# Patient Record
Sex: Male | Born: 1965 | Race: White | Hispanic: No | Marital: Married | Smoking: Never smoker
Health system: Southern US, Community
[De-identification: ages and names within clinical notes are randomized; demographics above are authoritative.]

## PROBLEM LIST (undated history)

## (undated) DIAGNOSIS — R059 Cough, unspecified: Secondary | ICD-10-CM

## (undated) DIAGNOSIS — R05 Cough: Secondary | ICD-10-CM

## (undated) DIAGNOSIS — M1990 Unspecified osteoarthritis, unspecified site: Secondary | ICD-10-CM

## (undated) HISTORY — PX: KNEE ARTHROSCOPY: SUR90

---

## 2002-11-30 ENCOUNTER — Ambulatory Visit (HOSPITAL_BASED_OUTPATIENT_CLINIC_OR_DEPARTMENT_OTHER): Admission: RE | Admit: 2002-11-30 | Discharge: 2002-11-30 | Payer: Self-pay | Admitting: Orthopedic Surgery

## 2003-01-09 HISTORY — PX: BICEPS TENDON REPAIR: SHX566

## 2006-03-22 ENCOUNTER — Emergency Department (HOSPITAL_COMMUNITY): Admission: EM | Admit: 2006-03-22 | Discharge: 2006-03-22 | Payer: Self-pay | Admitting: Emergency Medicine

## 2012-01-09 HISTORY — PX: KNEE ARTHROSCOPY: SUR90

## 2012-11-20 ENCOUNTER — Other Ambulatory Visit: Payer: Self-pay | Admitting: Orthopedic Surgery

## 2012-11-27 ENCOUNTER — Encounter (HOSPITAL_COMMUNITY): Payer: Self-pay

## 2012-11-27 ENCOUNTER — Encounter (HOSPITAL_COMMUNITY)
Admission: RE | Admit: 2012-11-27 | Discharge: 2012-11-27 | Disposition: A | Discharge status: Home / Self Care | Payer: BC Managed Care – PPO | Source: Ambulatory Visit | Attending: Orthopedic Surgery | Admitting: Orthopedic Surgery

## 2012-11-27 ENCOUNTER — Other Ambulatory Visit (HOSPITAL_COMMUNITY): Payer: Self-pay | Admitting: *Deleted

## 2012-11-27 DIAGNOSIS — Z01812 Encounter for preprocedural laboratory examination: Secondary | ICD-10-CM | POA: Insufficient documentation

## 2012-11-27 DIAGNOSIS — Z01818 Encounter for other preprocedural examination: Secondary | ICD-10-CM | POA: Insufficient documentation

## 2012-11-27 DIAGNOSIS — Z01810 Encounter for preprocedural cardiovascular examination: Secondary | ICD-10-CM | POA: Insufficient documentation

## 2012-11-27 HISTORY — DX: Unspecified osteoarthritis, unspecified site: M19.90

## 2012-11-27 HISTORY — DX: Cough, unspecified: R05.9

## 2012-11-27 HISTORY — DX: Cough: R05

## 2012-11-27 LAB — URINALYSIS, ROUTINE W REFLEX MICROSCOPIC
Glucose, UA: NEGATIVE mg/dL
Ketones, ur: NEGATIVE mg/dL
Leukocytes, UA: NEGATIVE
Nitrite: NEGATIVE
Specific Gravity, Urine: 1.007 (ref 1.005–1.030)
Urobilinogen, UA: 0.2 mg/dL (ref 0.0–1.0)
pH: 6 (ref 5.0–8.0)

## 2012-11-27 LAB — CBC WITH DIFFERENTIAL/PLATELET
Basophils Absolute: 0.1 10*3/uL (ref 0.0–0.1)
Basophils Relative: 1 % (ref 0–1)
Eosinophils Absolute: 0.2 10*3/uL (ref 0.0–0.7)
Eosinophils Relative: 2 % (ref 0–5)
HCT: 40.9 % (ref 39.0–52.0)
Hemoglobin: 14.4 g/dL (ref 13.0–17.0)
Lymphs Abs: 2.4 10*3/uL (ref 0.7–4.0)
MCHC: 35.2 g/dL (ref 30.0–36.0)
MCV: 90.5 fL (ref 78.0–100.0)
Monocytes Absolute: 0.6 10*3/uL (ref 0.1–1.0)
Neutro Abs: 3.5 10*3/uL (ref 1.7–7.7)
RBC: 4.52 MIL/uL (ref 4.22–5.81)
RDW: 13 % (ref 11.5–15.5)

## 2012-11-27 LAB — COMPREHENSIVE METABOLIC PANEL
AST: 23 U/L (ref 0–37)
Albumin: 4 g/dL (ref 3.5–5.2)
Calcium: 9.1 mg/dL (ref 8.4–10.5)
Creatinine, Ser: 0.91 mg/dL (ref 0.50–1.35)
GFR calc Af Amer: >90 mL/min (ref >90)
GFR calc non Af Amer: >90 mL/min (ref >90)
Total Protein: 7 g/dL (ref 6.0–8.3)

## 2012-11-27 LAB — TYPE AND SCREEN
ABO/RH(D): A NEG
Antibody Screen: NEGATIVE

## 2012-11-27 LAB — PROTIME-INR: INR: 0.98 (ref 0.00–1.49)

## 2012-11-27 LAB — APTT: aPTT: 27 seconds (ref 24–37)

## 2012-11-27 LAB — ABO/RH: ABO/RH(D): A NEG

## 2012-11-27 MED ORDER — CHLORHEXIDINE GLUCONATE 4 % EX LIQD: 60.0000 mL | Freq: Once | CUTANEOUS | Status: DC

## 2012-11-27 NOTE — Pre-Procedure Instructions (Signed)
Philip James  11/27/2012   Your procedure is scheduled on:  Monday, December 08, 2012 at 7:30 AM.   Report to [facility] Entrance "A" at 5:30 AM.   Call this number if you have problems the morning of surgery: [phone]   Remember:   Do not eat food or drink liquids after midnight Sunday, 12/07/12.   Take these medicines the morning of surgery with A SIP OF WATER: None   Do not wear jewelry.  Do not wear lotions, powders, or cologne. You may wear deodorant.             Men may shave face and neck.  Do not bring valuables to the hospital.  [facility] is not responsible   for any belongings or valuables.               Contacts, dentures or bridgework may not be worn into surgery.  Leave suitcase in the car. After surgery it may be brought to your room.  For patients admitted to the hospital, discharge time is determined by your  treatment team.               Special Instructions: Shower using CHG 2 nights before surgery and the night before surgery.  If you shower the day of surgery use CHG.  Use special wash - you have one bottle of CHG for all showers.  You should use approximately 1/3 of the bottle for each shower.   Please read over the following fact sheets that you were given: Pain Booklet, Coughing and Deep Breathing, Blood Transfusion Information, MRSA Information and Surgical Site Infection Prevention

## 2012-11-28 LAB — URINE CULTURE
Colony Count: NO GROWTH
Culture: NO GROWTH

## 2012-12-07 MED ORDER — SODIUM CHLORIDE 0.9 % IV SOLN
1000.0000 mg | INTRAVENOUS | Status: AC
  Administered 2012-12-08: 1000 mg via INTRAVENOUS
  Filled 2012-12-07: qty 10

## 2012-12-07 MED ORDER — CEFAZOLIN SODIUM-DEXTROSE 2-3 GM-% IV SOLR
2.0000 g | INTRAVENOUS | Status: AC
  Administered 2012-12-08: 2 g via INTRAVENOUS
  Filled 2012-12-07: qty 50

## 2012-12-08 ENCOUNTER — Encounter (HOSPITAL_COMMUNITY): Payer: BC Managed Care – PPO | Admitting: Anesthesiology

## 2012-12-08 ENCOUNTER — Encounter (HOSPITAL_COMMUNITY): Payer: Self-pay | Admitting: Anesthesiology

## 2012-12-08 ENCOUNTER — Inpatient Hospital Stay (HOSPITAL_COMMUNITY): Payer: BC Managed Care – PPO | Admitting: Anesthesiology

## 2012-12-08 ENCOUNTER — Encounter (HOSPITAL_COMMUNITY)
Admission: RE | Disposition: A | Discharge status: Home / Self Care | Payer: Self-pay | Source: Ambulatory Visit | Attending: Orthopedic Surgery

## 2012-12-08 ENCOUNTER — Inpatient Hospital Stay (HOSPITAL_COMMUNITY)
Admission: RE | Admit: 2012-12-08 | Discharge: 2012-12-09 | DRG: 470 | Disposition: A | Discharge status: Home / Self Care | Payer: BC Managed Care – PPO | Source: Ambulatory Visit | Attending: Orthopedic Surgery | Admitting: Orthopedic Surgery

## 2012-12-08 DIAGNOSIS — Z96659 Presence of unspecified artificial knee joint: Secondary | ICD-10-CM

## 2012-12-08 DIAGNOSIS — Z96652 Presence of left artificial knee joint: Secondary | ICD-10-CM

## 2012-12-08 DIAGNOSIS — M1710 Unilateral primary osteoarthritis, unspecified knee: Principal | ICD-10-CM | POA: Diagnosis present

## 2012-12-08 DIAGNOSIS — D62 Acute posthemorrhagic anemia: Secondary | ICD-10-CM | POA: Diagnosis present

## 2012-12-08 HISTORY — PX: TOTAL KNEE ARTHROPLASTY: SHX125

## 2012-12-08 LAB — CREATININE, SERUM: GFR calc Af Amer: >90 mL/min (ref >90)

## 2012-12-08 LAB — CBC
HCT: 36.9 % — ABNORMAL LOW (ref 39.0–52.0)
Hemoglobin: 12.9 g/dL — ABNORMAL LOW (ref 13.0–17.0)
MCV: 91.3 fL (ref 78.0–100.0)
RBC: 4.04 MIL/uL — ABNORMAL LOW (ref 4.22–5.81)
RDW: 13 % (ref 11.5–15.5)
WBC: 14.1 10*3/uL — ABNORMAL HIGH (ref 4.0–10.5)

## 2012-12-08 SURGERY — ARTHROPLASTY, KNEE, TOTAL
Anesthesia: General | Site: Knee | Laterality: Left | Wound class: Clean

## 2012-12-08 MED ORDER — OXYCODONE HCL 5 MG PO TABS
5.0000 mg | ORAL_TABLET | Freq: Once | ORAL | Status: AC | PRN
  Administered 2012-12-08: 5 mg via ORAL

## 2012-12-08 MED ORDER — SODIUM CHLORIDE 0.9 % IR SOLN
Status: DC | PRN
  Administered 2012-12-08: 3000 mL

## 2012-12-08 MED ORDER — ONDANSETRON HCL 4 MG/2ML IJ SOLN: 4.0000 mg | Freq: Once | INTRAMUSCULAR | Status: DC | PRN

## 2012-12-08 MED ORDER — BUPIVACAINE LIPOSOME 1.3 % IJ SUSP
INTRAMUSCULAR | Status: DC | PRN
  Administered 2012-12-08: 20 mL

## 2012-12-08 MED ORDER — OXYCODONE HCL 5 MG PO TABS
5.0000 mg | ORAL_TABLET | ORAL | Status: DC | PRN
  Administered 2012-12-08 – 2012-12-09 (×7): 10 mg via ORAL
  Filled 2012-12-08 (×7): qty 2

## 2012-12-08 MED ORDER — HYDROMORPHONE HCL PF 1 MG/ML IJ SOLN
0.2500 mg | INTRAMUSCULAR | Status: DC | PRN
  Administered 2012-12-08 (×3): 0.5 mg via INTRAVENOUS

## 2012-12-08 MED ORDER — METHOCARBAMOL 500 MG PO TABS
ORAL_TABLET | ORAL | Status: AC
  Filled 2012-12-08: qty 1

## 2012-12-08 MED ORDER — CEFAZOLIN SODIUM-DEXTROSE 2-3 GM-% IV SOLR
2.0000 g | Freq: Four times a day (QID) | INTRAVENOUS | Status: AC
  Administered 2012-12-08 (×2): 2 g via INTRAVENOUS
  Filled 2012-12-08 (×3): qty 50

## 2012-12-08 MED ORDER — ZOLPIDEM TARTRATE 5 MG PO TABS: 5.0000 mg | ORAL_TABLET | Freq: Every evening | ORAL | Status: DC | PRN

## 2012-12-08 MED ORDER — LACTATED RINGERS IV SOLN
INTRAVENOUS | Status: DC | PRN
  Administered 2012-12-08 (×2): via INTRAVENOUS

## 2012-12-08 MED ORDER — ENOXAPARIN SODIUM 30 MG/0.3ML ~~LOC~~ SOLN
30.0000 mg | Freq: Two times a day (BID) | SUBCUTANEOUS | Status: DC
  Administered 2012-12-09: 30 mg via SUBCUTANEOUS
  Filled 2012-12-08 (×3): qty 0.3

## 2012-12-08 MED ORDER — MENTHOL 3 MG MT LOZG: 1.0000 | LOZENGE | OROMUCOSAL | Status: DC | PRN

## 2012-12-08 MED ORDER — ACETAMINOPHEN 650 MG RE SUPP: 650.0000 mg | Freq: Four times a day (QID) | RECTAL | Status: DC | PRN

## 2012-12-08 MED ORDER — BISACODYL 5 MG PO TBEC
5.0000 mg | DELAYED_RELEASE_TABLET | Freq: Every day | ORAL | Status: DC | PRN
  Administered 2012-12-09: 5 mg via ORAL
  Filled 2012-12-08: qty 1

## 2012-12-08 MED ORDER — OXYCODONE HCL ER 10 MG PO T12A
10.0000 mg | EXTENDED_RELEASE_TABLET | Freq: Two times a day (BID) | ORAL | Status: DC
  Administered 2012-12-08 – 2012-12-09 (×3): 10 mg via ORAL
  Filled 2012-12-08 (×4): qty 1

## 2012-12-08 MED ORDER — ACETAMINOPHEN 325 MG PO TABS: 650.0000 mg | ORAL_TABLET | Freq: Four times a day (QID) | ORAL | Status: DC | PRN

## 2012-12-08 MED ORDER — BUPIVACAINE-EPINEPHRINE PF 0.5-1:200000 % IJ SOLN
INTRAMUSCULAR | Status: DC | PRN
  Administered 2012-12-08: 30 mL via PERINEURAL

## 2012-12-08 MED ORDER — PHENOL 1.4 % MT LIQD: 1.0000 | OROMUCOSAL | Status: DC | PRN

## 2012-12-08 MED ORDER — OXYCODONE HCL 5 MG/5ML PO SOLN: 5.0000 mg | Freq: Once | ORAL | Status: AC | PRN

## 2012-12-08 MED ORDER — MIDAZOLAM HCL 5 MG/5ML IJ SOLN
INTRAMUSCULAR | Status: DC | PRN
  Administered 2012-12-08 (×2): 1 mg via INTRAVENOUS

## 2012-12-08 MED ORDER — BUPIVACAINE-EPINEPHRINE (PF) 0.5% -1:200000 IJ SOLN
INTRAMUSCULAR | Status: AC
  Filled 2012-12-08: qty 10

## 2012-12-08 MED ORDER — SENNOSIDES-DOCUSATE SODIUM 8.6-50 MG PO TABS: 1.0000 | ORAL_TABLET | Freq: Every evening | ORAL | Status: DC | PRN

## 2012-12-08 MED ORDER — OXYCODONE HCL 5 MG PO TABS
ORAL_TABLET | ORAL | Status: AC
  Filled 2012-12-08: qty 1

## 2012-12-08 MED ORDER — HYDROMORPHONE HCL PF 1 MG/ML IJ SOLN
1.0000 mg | INTRAMUSCULAR | Status: DC | PRN
  Administered 2012-12-08 – 2012-12-09 (×4): 1 mg via INTRAVENOUS
  Filled 2012-12-08 (×4): qty 1

## 2012-12-08 MED ORDER — FENTANYL CITRATE 0.05 MG/ML IJ SOLN
INTRAMUSCULAR | Status: DC | PRN
  Administered 2012-12-08 (×4): 50 ug via INTRAVENOUS

## 2012-12-08 MED ORDER — ONDANSETRON HCL 4 MG/2ML IJ SOLN
4.0000 mg | Freq: Four times a day (QID) | INTRAMUSCULAR | Status: DC | PRN
  Administered 2012-12-08: 4 mg via INTRAVENOUS
  Filled 2012-12-08 (×2): qty 2

## 2012-12-08 MED ORDER — LIDOCAINE HCL (CARDIAC) 20 MG/ML IV SOLN
INTRAVENOUS | Status: DC | PRN
  Administered 2012-12-08: 100 mg via INTRAVENOUS

## 2012-12-08 MED ORDER — ONDANSETRON HCL 4 MG/2ML IJ SOLN
INTRAMUSCULAR | Status: DC | PRN
  Administered 2012-12-08: 4 mg via INTRAVENOUS

## 2012-12-08 MED ORDER — FLEET ENEMA 7-19 GM/118ML RE ENEM: 1.0000 | ENEMA | Freq: Once | RECTAL | Status: AC | PRN

## 2012-12-08 MED ORDER — ONDANSETRON HCL 4 MG PO TABS
4.0000 mg | ORAL_TABLET | Freq: Four times a day (QID) | ORAL | Status: DC | PRN
  Filled 2012-12-08: qty 1

## 2012-12-08 MED ORDER — SODIUM CHLORIDE 0.9 % IV SOLN
INTRAVENOUS | Status: DC
  Administered 2012-12-08 – 2012-12-09 (×2): via INTRAVENOUS

## 2012-12-08 MED ORDER — HYDROMORPHONE HCL PF 1 MG/ML IJ SOLN
INTRAMUSCULAR | Status: AC
  Filled 2012-12-08: qty 1

## 2012-12-08 MED ORDER — CELECOXIB 200 MG PO CAPS
200.0000 mg | ORAL_CAPSULE | Freq: Two times a day (BID) | ORAL | Status: DC
  Administered 2012-12-08 – 2012-12-09 (×3): 200 mg via ORAL
  Filled 2012-12-08 (×4): qty 1

## 2012-12-08 MED ORDER — SODIUM CHLORIDE 0.9 % IV SOLN
INTRAVENOUS | Status: DC | PRN
  Administered 2012-12-08: 09:00:00 via INTRAVENOUS

## 2012-12-08 MED ORDER — BUPIVACAINE LIPOSOME 1.3 % IJ SUSP
20.0000 mL | INTRAMUSCULAR | Status: DC
  Filled 2012-12-08: qty 20

## 2012-12-08 MED ORDER — DOCUSATE SODIUM 100 MG PO CAPS
100.0000 mg | ORAL_CAPSULE | Freq: Two times a day (BID) | ORAL | Status: DC
  Administered 2012-12-08 – 2012-12-09 (×3): 100 mg via ORAL
  Filled 2012-12-08 (×4): qty 1

## 2012-12-08 MED ORDER — METOCLOPRAMIDE HCL 5 MG/ML IJ SOLN
5.0000 mg | Freq: Three times a day (TID) | INTRAMUSCULAR | Status: DC | PRN
  Filled 2012-12-08: qty 2

## 2012-12-08 MED ORDER — DEXTROSE 5 % IV SOLN
INTRAVENOUS | Status: DC | PRN
  Administered 2012-12-08: 08:00:00 via INTRAVENOUS

## 2012-12-08 MED ORDER — MEPERIDINE HCL 25 MG/ML IJ SOLN: 6.2500 mg | INTRAMUSCULAR | Status: DC | PRN

## 2012-12-08 MED ORDER — ALUM & MAG HYDROXIDE-SIMETH 200-200-20 MG/5ML PO SUSP: 30.0000 mL | ORAL | Status: DC | PRN

## 2012-12-08 MED ORDER — METHOCARBAMOL 100 MG/ML IJ SOLN
500.0000 mg | Freq: Four times a day (QID) | INTRAVENOUS | Status: DC | PRN
  Filled 2012-12-08: qty 5

## 2012-12-08 MED ORDER — CEFAZOLIN SODIUM-DEXTROSE 2-3 GM-% IV SOLR
INTRAVENOUS | Status: AC
  Filled 2012-12-08: qty 50

## 2012-12-08 MED ORDER — METOCLOPRAMIDE HCL 5 MG PO TABS
5.0000 mg | ORAL_TABLET | Freq: Three times a day (TID) | ORAL | Status: DC | PRN
  Filled 2012-12-08: qty 2

## 2012-12-08 MED ORDER — DIPHENHYDRAMINE HCL 12.5 MG/5ML PO ELIX: 12.5000 mg | ORAL_SOLUTION | ORAL | Status: DC | PRN

## 2012-12-08 MED ORDER — BUPIVACAINE-EPINEPHRINE 0.5% -1:200000 IJ SOLN
INTRAMUSCULAR | Status: DC | PRN
Start: 2012-12-08 — End: 2012-12-08
  Administered 2012-12-08: 30 mL

## 2012-12-08 MED ORDER — METHOCARBAMOL 500 MG PO TABS
500.0000 mg | ORAL_TABLET | Freq: Four times a day (QID) | ORAL | Status: DC | PRN
  Administered 2012-12-08 (×2): 500 mg via ORAL
  Filled 2012-12-08 (×2): qty 1

## 2012-12-08 MED ORDER — PROPOFOL 10 MG/ML IV BOLUS
INTRAVENOUS | Status: DC | PRN
  Administered 2012-12-08: 200 mg via INTRAVENOUS

## 2012-12-08 SURGICAL SUPPLY — 70 items
BANDAGE ESMARK 6X9 LF (GAUZE/BANDAGES/DRESSINGS) ×1 IMPLANT
BLADE SAGITTAL 13X1.27X60 (BLADE) ×2 IMPLANT
BLADE SAW SGTL 83.5X18.5 (BLADE) ×2 IMPLANT
BNDG CMPR 9X6 STRL LF SNTH (GAUZE/BANDAGES/DRESSINGS) ×1
BNDG ESMARK 6X9 LF (GAUZE/BANDAGES/DRESSINGS) ×2
BOWL SMART MIX CTS (DISPOSABLE) ×3 IMPLANT
CAP POR NKTM CP VIT E LN CER ×1 IMPLANT
CAP POR TM CP VIT E LN CER HD ×1 IMPLANT
CEMENT BONE SIMPLEX SPEEDSET (Cement) ×5 IMPLANT
CLOTH BEACON ORANGE TIMEOUT ST (SAFETY) ×1 IMPLANT
COVER MAYO STAND STRL (DRAPES) ×2 IMPLANT
COVER SURGICAL LIGHT HANDLE (MISCELLANEOUS) ×2 IMPLANT
CUFF TOURNIQUET SINGLE 34IN LL (TOURNIQUET CUFF) ×2 IMPLANT
DRAPE EXTREMITY T 121X128X90 (DRAPE) ×2 IMPLANT
DRAPE INCISE IOBAN 66X45 STRL (DRAPES) ×4 IMPLANT
DRAPE PROXIMA HALF (DRAPES) ×2 IMPLANT
DRAPE TABLE COVER HEAVY DUTY (DRAPES) ×1 IMPLANT
DRAPE U-SHAPE 47X51 STRL (DRAPES) ×3 IMPLANT
DRSG ADAPTIC 3X8 NADH LF (GAUZE/BANDAGES/DRESSINGS) ×2 IMPLANT
DRSG PAD ABDOMINAL 8X10 ST (GAUZE/BANDAGES/DRESSINGS) ×2 IMPLANT
DURAPREP 26ML APPLICATOR (WOUND CARE) ×4 IMPLANT
ELECT REM PT RETURN 9FT ADLT (ELECTROSURGICAL) ×2
ELECTRODE REM PT RTRN 9FT ADLT (ELECTROSURGICAL) ×1 IMPLANT
EVACUATOR 1/8 PVC DRAIN (DRAIN) ×2 IMPLANT
GLOVE BIOGEL M 7.0 STRL (GLOVE) IMPLANT
GLOVE BIOGEL PI IND STRL 6.5 (GLOVE) IMPLANT
GLOVE BIOGEL PI IND STRL 7.5 (GLOVE) IMPLANT
GLOVE BIOGEL PI IND STRL 8 (GLOVE) IMPLANT
GLOVE BIOGEL PI IND STRL 8.5 (GLOVE) ×2 IMPLANT
GLOVE BIOGEL PI INDICATOR 6.5 (GLOVE) ×1
GLOVE BIOGEL PI INDICATOR 7.5 (GLOVE)
GLOVE BIOGEL PI INDICATOR 8 (GLOVE) ×1
GLOVE BIOGEL PI INDICATOR 8.5 (GLOVE) ×2
GLOVE SURG ORTHO 8.0 STRL STRW (GLOVE) ×4 IMPLANT
GLOVE SURG SS PI 6.5 STRL IVOR (GLOVE) ×1 IMPLANT
GOWN PREVENTION PLUS XLARGE (GOWN DISPOSABLE) ×4 IMPLANT
GOWN STRL NON-REIN LRG LVL3 (GOWN DISPOSABLE) ×4 IMPLANT
HANDPIECE INTERPULSE COAX TIP (DISPOSABLE) ×2
HOOD PEEL AWAY FACE SHEILD DIS (HOOD) ×7 IMPLANT
KIT BASIN OR (CUSTOM PROCEDURE TRAY) ×2 IMPLANT
KIT ROOM TURNOVER OR (KITS) ×2 IMPLANT
MANIFOLD NEPTUNE II (INSTRUMENTS) ×2 IMPLANT
NDL HYPO 21X1.5 SAFETY (NEEDLE) ×1 IMPLANT
NEEDLE 22X1 1/2 (OR ONLY) (NEEDLE) ×2 IMPLANT
NEEDLE HYPO 21X1.5 SAFETY (NEEDLE) IMPLANT
NS IRRIG 1000ML POUR BTL (IV SOLUTION) ×2 IMPLANT
PACK ORTHO EXTREMITY (CUSTOM PROCEDURE TRAY) ×1 IMPLANT
PACK TOTAL JOINT (CUSTOM PROCEDURE TRAY) ×2 IMPLANT
PACK UNIVERSAL I (CUSTOM PROCEDURE TRAY) ×1 IMPLANT
PAD ARMBOARD 7.5X6 YLW CONV (MISCELLANEOUS) ×4 IMPLANT
PADDING CAST COTTON 6X4 STRL (CAST SUPPLIES) ×2 IMPLANT
Persona femur, cemented, standard IMPLANT
SET HNDPC FAN SPRY TIP SCT (DISPOSABLE) ×1 IMPLANT
SPONGE GAUZE 4X4 12PLY (GAUZE/BANDAGES/DRESSINGS) ×2 IMPLANT
STAPLER VISISTAT 35W (STAPLE) ×2 IMPLANT
SUCTION FRAZIER TIP 10 FR DISP (SUCTIONS) ×2 IMPLANT
SUT BONE WAX W31G (SUTURE) ×2 IMPLANT
SUT VIC AB 0 CTB1 27 (SUTURE) ×4 IMPLANT
SUT VIC AB 1 CT1 27 (SUTURE) ×6
SUT VIC AB 1 CT1 27XBRD ANBCTR (SUTURE) ×2 IMPLANT
SUT VIC AB 2-0 CT1 27 (SUTURE) ×4
SUT VIC AB 2-0 CT1 TAPERPNT 27 (SUTURE) ×2 IMPLANT
SYR BULB IRRIGATION 50ML (SYRINGE) ×1 IMPLANT
SYR CONTROL 10ML LL (SYRINGE) ×2 IMPLANT
TOWEL OR 17X24 6PK STRL BLUE (TOWEL DISPOSABLE) ×2 IMPLANT
TOWEL OR 17X26 10 PK STRL BLUE (TOWEL DISPOSABLE) ×2 IMPLANT
TRAY FOLEY CATH 16FRSI W/METER (SET/KITS/TRAYS/PACK) ×1 IMPLANT
TUBE CONNECTING 12X1/4 (SUCTIONS) ×1 IMPLANT
WATER STERILE IRR 1000ML POUR (IV SOLUTION) ×2 IMPLANT
YANKAUER SUCT BULB TIP NO VENT (SUCTIONS) ×1 IMPLANT

## 2012-12-08 NOTE — Evaluation (Signed)
Physical Therapy Evaluation Patient Details Name: Philip James MRN: [id number] DOB: 09-21-1965 Today's Date: 12/08/2012 Time: 4098-1191 PT Time Calculation (min): 28 min  PT Assessment / Plan / Recommendation History of Present Illness  Patient is a 47 yo male s/p Lt TKA..  Clinical Impression  Patient presents with problems listed below.  Will benefit from acute PT to maximize independence prior to discharge home with wife.  Should progress well with PT.    PT Assessment  Patient needs continued PT services    Follow Up Recommendations  Home health PT;Supervision/Assistance - 24 hour    Does the patient have the potential to tolerate intense rehabilitation      Barriers to Discharge        Equipment Recommendations  None recommended by PT    Recommendations for Other Services     Frequency 7X/week    Precautions / Restrictions Precautions Precautions: Knee Precaution Booklet Issued: Yes (comment) Precaution Comments: Provided education to pt and wife. Restrictions Weight Bearing Restrictions: Yes LLE Weight Bearing: Weight bearing as tolerated   Pertinent Vitals/Pain       Mobility  Bed Mobility Bed Mobility: Supine to Sit;Sitting - Scoot to Edge of Bed Supine to Sit: 4: Min assist;With rails;HOB elevated Sitting - Scoot to Edge of Bed: 4: Min guard;With rail Details for Bed Mobility Assistance: Verbal cues for technique.  Assist to move LLE off of bed.  Sitting balance good.  Patient sat EOB x 10 minutes - attempting to void and due to feeling lightheaded. Transfers Transfers: Sit to Stand;Stand to Sit Sit to Stand: 4: Min assist;With upper extremity assist;From bed Stand to Sit: 4: Min assist;With upper extremity assist;With armrests;To chair/3-in-1 Details for Transfer Assistance: Verbal cues for hand placement.  Assist for balance. Ambulation/Gait Ambulation/Gait Assistance: 4: Min guard Ambulation Distance (Feet): 18 Feet Assistive device: Rolling  walker Ambulation/Gait Assistance Details: Verbal and visual cues for safe use of RW.  Cues for gait sequence.   Gait Pattern: Step-to pattern;Decreased stance time - left;Decreased step length - right;Antalgic Gait velocity: Slow gait speed    Exercises Total Joint Exercises Ankle Circles/Pumps: AROM;Both;10 reps;Seated   PT Diagnosis: Difficulty walking;Acute pain  PT Problem List: Decreased strength;Decreased range of motion;Decreased activity tolerance;Decreased balance;Decreased mobility;Decreased knowledge of use of DME;Decreased knowledge of precautions;Pain PT Treatment Interventions: DME instruction;Gait training;Stair training;Functional mobility training;Therapeutic exercise;Patient/family education     PT Goals(Current goals can be found in the care plan section) Acute Rehab PT Goals Patient Stated Goal: To feel better.  To go home tomorrow PT Goal Formulation: With patient/family Time For Goal Achievement: 12/15/12 Potential to Achieve Goals: Good  Visit Information  Last PT Received On: 12/08/12 Assistance Needed: +1 History of Present Illness: Patient is a 47 yo male s/p Lt TKA..       Prior Functioning  Home Living Family/patient expects to be discharged to:: Private residence Living Arrangements: Spouse/significant other Available Help at Discharge: Family;Available 24 hours/day Type of Home: House Home Access: Stairs to enter [employer] of Steps: 3 Entrance Stairs-Rails: None Home Layout: Two level;Able to live on main level with bedroom/bathroom Home Equipment: Dan Humphreys - 2 wheels Prior Function Level of Independence: Independent Communication Communication: No difficulties    Cognition  Cognition Arousal/Alertness: Lethargic;Suspect due to medications (Lightheaded) Behavior During Therapy: WFL for tasks assessed/performed Overall Cognitive Status: Within Functional Limits for tasks assessed    Extremity/Trunk Assessment Upper Extremity  Assessment Upper Extremity Assessment: Overall WFL for tasks assessed Lower Extremity Assessment Lower Extremity  Assessment: LLE deficits/detail LLE Deficits / Details: Decreased strength and ROM due to surgery/pain.  Patient is able to lift LLE off of bed - at least 3/5 LLE: Unable to fully assess due to pain Cervical / Trunk Assessment Cervical / Trunk Assessment: Normal   Balance Balance Balance Assessed: Yes Static Sitting Balance Static Sitting - Balance Support: No upper extremity supported;Feet supported Static Sitting - Level of Assistance: 5: Stand by assistance Static Sitting - Comment/# of Minutes: 10 Static Standing Balance Static Standing - Balance Support: Bilateral upper extremity supported Static Standing - Level of Assistance: 5: Stand by assistance Static Standing - Comment/# of Minutes: 2  End of Session PT - End of Session Equipment Utilized During Treatment: Gait belt Activity Tolerance: Patient limited by pain;Patient limited by lethargy (Limited by nausea and lightheadedness) Patient left: in chair;with call bell/phone within reach;with family/visitor present Nurse Communication: Mobility status (Nausea) CPM Left Knee CPM Left Knee: Off (off at 14:30)  GP     Vena Austria 12/08/2012, 3:36 PM Durenda Hurt. Renaldo Fiddler, [facility] Acute Rehab Services Pager [phone]

## 2012-12-08 NOTE — Progress Notes (Signed)
Orthopedic Tech Progress Note Patient Details:  Philip James 1965-02-25 [id number] CPM applied to Left LE with appropriate settings. OHF applied to bed.  CPM Left Knee CPM Left Knee: On Left Knee Flexion (Degrees): 90 Left Knee Extension (Degrees): 0   Asia R Thompson 12/08/2012, 10:42 AM

## 2012-12-08 NOTE — Preoperative (Signed)
Beta Blockers   Reason not to administer Beta Blockers:Not Applicable 

## 2012-12-08 NOTE — Progress Notes (Signed)
UR review completed. 

## 2012-12-08 NOTE — Anesthesia Preprocedure Evaluation (Addendum)
Anesthesia Evaluation  Patient identified by MRN, date of birth, ID band Patient awake    Reviewed: Allergy & Precautions, H&P , NPO status , Patient's Chart, lab work & pertinent test results, reviewed documented beta blocker date and time   Airway Mallampati: I TM Distance: >3 FB Neck ROM: Full    Dental  (+) Teeth Intact and Dental Advisory Given   Pulmonary          Cardiovascular     Neuro/Psych    GI/Hepatic   Endo/Other    Renal/GU      Musculoskeletal   Abdominal   Peds  Hematology   Anesthesia Other Findings   Reproductive/Obstetrics                           Anesthesia Physical Anesthesia Plan  ASA: II  Anesthesia Plan: General   Post-op Pain Management:    Induction: Intravenous  Airway Management Planned: LMA  Additional Equipment:   Intra-op Plan:   Post-operative Plan: Extubation in OR  Informed Consent: I have reviewed the patients History and Physical, chart, labs and discussed the procedure including the risks, benefits and alternatives for the proposed anesthesia with the patient or authorized representative who has indicated his/her understanding and acceptance.     Plan Discussed with: CRNA and Surgeon  Anesthesia Plan Comments:         Anesthesia Quick Evaluation

## 2012-12-08 NOTE — H&P (Signed)
Philip James MRN:  [id number] DOB/SEX:  Dec 14, 1965/male  CHIEF COMPLAINT:  Painful left Knee  HISTORY: Patient is a 47 y.o. male presented with a history of pain in the left knee. Onset of symptoms was gradual starting several years ago with gradually worsening course since that time. Prior procedures on the knee include none. Patient has been treated conservatively with over-the-counter NSAIDs and activity modification. Patient currently rates pain in the knee at 8 out of 10 with activity. There is no pain at night.  PAST MEDICAL HISTORY: There are no active problems to display for this patient.  Past Medical History  Diagnosis Date  . Cough     developed a cough after getting a flu shot  . Arthritis    Past Surgical History  Procedure Laterality Date  . Knee arthroscopy Right 2014  . Knee arthroscopy Left     x8 cartilage repair, x3 ACL most recent 3 years ago  . Biceps tendon repair Right 2005     MEDICATIONS:   Prescriptions prior to admission  Medication Sig Dispense Refill  . Multiple Vitamins-Minerals (GNP MEGA MULTI FOR MEN) TABS Take 8 tablets by mouth daily. (multi vitamin pack)      . tadalafil (CIALIS) 5 MG tablet Take 2.5 mg by mouth daily as needed for erectile dysfunction.      . Testosterone (ANDROGEL PUMP) 20.25 MG/ACT (1.62%) GEL Place 20.25 mg onto the skin daily.        ALLERGIES:   Allergies  Allergen Reactions  . Hydrocodone     Gives patient hiccups    REVIEW OF SYSTEMS:  Pertinent items are noted in HPI.   FAMILY HISTORY:  No family history on file.  SOCIAL HISTORY:   History  Substance Use Topics  . Smoking status: Never Smoker   . Smokeless tobacco: Current User     Comment: ocasionally snuff  . Alcohol Use: Yes     Comment: ocasionally     EXAMINATION:  Vital signs in last 24 hours: Temp:  [97 F (36.1 C)] 97 F (36.1 C) (12/01 0628) Pulse Rate:  [50] 50 (12/01 0628) Resp:  [20] 20 (12/01 0628) BP: (122)/(69) 122/69 mmHg  (12/01 0628) SpO2:  [98 %] 98 % (12/01 0628)  General appearance: alert, cooperative and no distress Lungs: clear to auscultation bilaterally Heart: regular rate and rhythm, S1, S2 normal, no murmur, click, rub or gallop Abdomen: soft, non-tender; bowel sounds normal; no masses,  no organomegaly Extremities: extremities normal, atraumatic, no cyanosis or edema and Homans sign is negative, no sign of DVT Pulses: 2+ and symmetric Skin: Skin color, texture, turgor normal. No rashes or lesions Neurologic: Alert and oriented X 3, normal strength and tone. Normal symmetric reflexes. Normal coordination and gait  Musculoskeletal:  ROM 0-120, Ligaments intact,  Imaging Review Plain radiographs demonstrate severe degenerative joint disease of the left knee. The overall alignment is mild valgus. The bone quality appears to be excellent for age and reported activity level.  Assessment/Plan: End stage arthritis, left knee   The patient history, physical examination and imaging studies are consistent with advanced degenerative joint disease of the left knee. The patient has failed conservative treatment.  The clearance notes were reviewed.  After discussion with the patient it was felt that Total Knee Replacement was indicated. The procedure,  risks, and benefits of total knee arthroplasty were presented and reviewed. The risks including but not limited to aseptic loosening, infection, blood clots, vascular injury, stiffness, patella tracking problems complications  among others were discussed. The patient acknowledged the explanation, agreed to proceed with the plan.  Zachary Lovins 12/08/2012, 6:51 AM

## 2012-12-08 NOTE — Transfer of Care (Signed)
Immediate Anesthesia Transfer of Care Note  Patient: Philip James  Procedure(s) Performed: Procedure(s): TOTAL KNEE ARTHROPLASTY (Left)  Patient Location: PACU  Anesthesia Type:General  Level of Consciousness: awake and patient cooperative  Airway & Oxygen Therapy: Patient Spontanous Breathing and Patient connected to nasal cannula oxygen  James-op Assessment: Report given to PACU RN, James -op Vital signs reviewed and stable, Patient moving all extremities and shivering  James vital signs: Reviewed and stable  Complications: No apparent anesthesia complications

## 2012-12-08 NOTE — Anesthesia Postprocedure Evaluation (Signed)
Anesthesia Post Note  Patient: Philip James  Procedure(s) Performed: Procedure(s) (LRB): TOTAL KNEE ARTHROPLASTY (Left)  Anesthesia type: general  Patient location: PACU  Post pain: Pain level controlled  Post assessment: Patient's Cardiovascular Status Stable  Last Vitals:  Filed Vitals:   12/08/12 1150  BP: 154/73  Pulse: 86  Temp: 37.1 C  Resp: 16    Post vital signs: Reviewed and stable  Level of consciousness: sedated  Complications: No apparent anesthesia complications

## 2012-12-08 NOTE — Anesthesia Procedure Notes (Addendum)
Anesthesia Regional Block:  Adductor canal block  Pre-Anesthetic Checklist: ,, timeout performed, Correct Patient, Correct Site, Correct Laterality, Correct Procedure, Correct Position, site marked, Risks and benefits discussed,  Surgical consent,  Pre-op evaluation,  At surgeon's request and post-op pain management  Laterality: Left  Prep: chloraprep       Needles:  Injection technique: Single-shot  Needle Type: Echogenic Stimulator Needle      Needle Gauge: 21 and 21 G    Additional Needles:  Procedures: ultrasound guided (picture in chart) Adductor canal block Narrative:  Start time: 12/08/2012 7:10 AM End time: 12/08/2012 7:20 AM Injection made incrementally with aspirations every 5 mL.  Performed by: Personally  Anesthesiologist: Arta Bruce MD  Additional Notes: Monitors applied. Patient sedated. Sterile prep and drape,hand hygiene and sterile gloves were used. Relevant anatomy identified.Needle position confirmed.Local anesthetic injected incrementally after negative aspiration. Local anesthetic spread visualized around nerve(s). Vascular puncture avoided. No complications. Image printed for medical record.The patient tolerated the procedure well.    Arta Bruce MD   Procedure Name: LMA Insertion Date/Time: 12/08/2012 7:39 AM Performed by: Marni Griffon Pre-anesthesia Checklist: Patient identified, Suction available, Patient being monitored and Emergency Drugs available Patient Re-evaluated:Patient Re-evaluated prior to inductionOxygen Delivery Method: Circle system utilized Preoxygenation: Pre-oxygenation with 100% oxygen Intubation Type: IV induction Ventilation: Mask ventilation without difficulty LMA: LMA inserted LMA Size: 5.0 Number of attempts: 1 Placement Confirmation: breath sounds checked- equal and bilateral and positive ETCO2 ETT to lip (cm): taped across cheeks; gauze roll b/t teeth. Tube secured with: Tape Dental Injury: Teeth and Oropharynx as  per pre-operative assessment

## 2012-12-09 LAB — CBC
HCT: 34.9 % — ABNORMAL LOW (ref 39.0–52.0)
Hemoglobin: 11.9 g/dL — ABNORMAL LOW (ref 13.0–17.0)
MCH: 31.3 pg (ref 26.0–34.0)
MCHC: 34.1 g/dL (ref 30.0–36.0)
MCV: 91.8 fL (ref 78.0–100.0)
Platelets: 203 K/uL (ref 150–400)
RBC: 3.8 MIL/uL — ABNORMAL LOW (ref 4.22–5.81)
RDW: 13.2 % (ref 11.5–15.5)
WBC: 12 K/uL — ABNORMAL HIGH (ref 4.0–10.5)

## 2012-12-09 LAB — BASIC METABOLIC PANEL
BUN: 10 mg/dL (ref 6–23)
CO2: 24 mEq/L (ref 19–32)
Calcium: 8.2 mg/dL — ABNORMAL LOW (ref 8.4–10.5)
Chloride: 104 mEq/L (ref 96–112)
Creatinine, Ser: 0.94 mg/dL (ref 0.50–1.35)
GFR calc Af Amer: >90 mL/min (ref >90)
GFR calc non Af Amer: >90 mL/min (ref >90)
Glucose, Bld: 113 mg/dL — ABNORMAL HIGH (ref 70–99)
Potassium: 4 mEq/L (ref 3.5–5.1)
Sodium: 139 mEq/L (ref 135–145)

## 2012-12-09 MED ORDER — METHOCARBAMOL 500 MG PO TABS: 500.0000 mg | ORAL_TABLET | Freq: Four times a day (QID) | ORAL | Status: AC | PRN

## 2012-12-09 MED ORDER — OXYCODONE HCL ER 10 MG PO T12A: 10.0000 mg | EXTENDED_RELEASE_TABLET | Freq: Two times a day (BID) | ORAL | Status: AC

## 2012-12-09 MED ORDER — ENOXAPARIN SODIUM 40 MG/0.4ML ~~LOC~~ SOLN: 40.0000 mg | SUBCUTANEOUS | Status: AC

## 2012-12-09 MED ORDER — OXYCODONE HCL 5 MG PO TABS: 5.0000 mg | ORAL_TABLET | ORAL | Status: AC | PRN

## 2012-12-09 NOTE — Evaluation (Signed)
Occupational Therapy Evaluation and Discharge Patient Details Name: Philip James MRN: [id number] DOB: 1965-10-18 Today's Date: 12/09/2012 Time: 9147-8295 OT Time Calculation (min): 25 min  OT Assessment / Plan / Recommendation History of present illness Patient is a 47 yo male s/p Lt TKA..   Clinical Impression   This 47 yo male admitted and underwent above presents to acute OT with all education completed with pt and wife. No further OT needs, will sign off.    OT Assessment  Patient does not need any further OT services    Follow Up Recommendations  No OT follow up       Equipment Recommendations  None recommended by OT          Precautions / Restrictions Precautions Precautions: Knee Precaution Comments: Provided education to pt and wife. Restrictions LLE Weight Bearing: Weight bearing as tolerated       ADL  Toilet Transfer: Min Pension scheme manager Method: Sit to Barista:  (drop down shower seat that is the same height as a regular toilet) Tub/Shower Transfer: Insurance risk surveyor Method: Ecologist with back Equipment Used: Gait belt;Rolling walker Transfers/Ambulation Related to ADLs: min guard A -S for sit<>stand; S for ambulation      OT Goals(Current goals can be found in the care plan section) Acute Rehab OT Goals Patient Stated Goal: Home today OT Goal Formulation: With patient/family  Visit Information  Last OT Received On: 12/09/12 Assistance Needed: +1 PT/OT/SLP Co-Evaluation/Treatment: Yes (partial, while in gym (for stairs and tub transfer)) OT goals addressed during session: ADL's and self-care;Proper use of Adaptive equipment and DME History of Present Illness: Patient is a 47 yo male s/p Lt TKA..       Prior Functioning     Home Living Family/patient expects to be discharged to:: Private residence Living Arrangements: Spouse/significant other Available  Help at Discharge: Family;Available 24 hours/day Type of Home: House Home Access: Stairs to enter Entrance Stairs-Rails: None Home Layout: Two level;Able to live on main level with bedroom/bathroom Home Equipment: Dan Humphreys - 2 wheels Prior Function Level of Independence: Independent Communication Communication: No difficulties Dominant Hand: Right         Vision/Perception Vision - History Patient Visual Report: No change from baseline   Cognition  Cognition Arousal/Alertness: Awake/alert Behavior During Therapy: WFL for tasks assessed/performed Overall Cognitive Status: Within Functional Limits for tasks assessed    Extremity/Trunk Assessment Upper Extremity Assessment Upper Extremity Assessment: Overall WFL for tasks assessed     Mobility Transfers Sit to Stand: 4: Min guard;With upper extremity assist;With armrests;From chair/3-in-1 Stand to Sit: 4: Min guard;With upper extremity assist;With armrests;To chair/3-in-1 Details for Transfer Assistance: Pt demonstrated safe hand placement.             End of Session OT - End of Session Equipment Utilized During Treatment: Gait belt;Rolling walker Activity Tolerance: Patient tolerated treatment well Patient left: in chair       Evette Georges [phone] 12/09/2012, 12:03 PM

## 2012-12-09 NOTE — Progress Notes (Signed)
Physical Therapy Treatment Patient Details Name: Philip James MRN: [id number] DOB: Sep 10, 1965 Today's Date: 12/09/2012 Time: 1191-4782 PT Time Calculation (min): 38 min (partial co-tx with OT)  PT Assessment / Plan / Recommendation  History of Present Illness Patient is a 47 yo male s/p Lt TKA..   PT Comments   Pt progressing well with mobility & PT goals.  Ambulated from ortho gym>room & completed stair training.  Gait training with use of crutches-- pt demonstrated safe use of crutches & good stability.  Pt safe to d/c home from mobility standpoint when MD feels medically ready.     Follow Up Recommendations  Home health PT;Supervision/Assistance - 24 hour     Does the patient have the potential to tolerate intense rehabilitation     Barriers to Discharge        Equipment Recommendations  None recommended by PT    Recommendations for Other Services    Frequency 7X/week   Progress towards PT Goals Progress towards PT goals: Progressing toward goals  Plan Current plan remains appropriate    Precautions / Restrictions Precautions Precautions: Knee Precaution Comments: Provided education to pt and wife. Restrictions LLE Weight Bearing: Weight bearing as tolerated   Pertinent Vitals/Pain 8/10 Lt knee.  Repositioned for comfort.      Mobility  Transfers Transfers: Sit to Stand;Stand to Sit Sit to Stand: 4: Min guard;With upper extremity assist;With armrests;From chair/3-in-1 Stand to Sit: 4: Min guard;With upper extremity assist;With armrests;To chair/3-in-1 Details for Transfer Assistance: Pt demonstrated safe hand placement.   Ambulation/Gait Ambulation/Gait Assistance: 4: Min guard Ambulation Distance (Feet): 150 Feet Assistive device: Crutches Ambulation/Gait Assistance Details: Pt states he is familiar with crutches & would like to use them.  Pt demonstrated safe use of crutches.  Cues to increase WBing through LLE Gait Pattern: Step-through pattern;Decreased  weight shift to left Stairs: Yes Stairs Assistance: 4: Min guard Stairs Assistance Details (indicate cue type and reason): cues for sequencing & technique Stair Management Technique: No rails;Forwards;Step to pattern;With crutches Number of Stairs: 3 Wheelchair Mobility Wheelchair Mobility: No    Exercises Total Joint Exercises Ankle Circles/Pumps: AROM;Both;10 reps Quad Sets: AROM;Both;10 reps Heel Slides: AAROM;Strengthening;Left;10 reps Hip ABduction/ADduction: Strengthening;Left;10 reps;AROM Straight Leg Raises: AROM;Strengthening;Left;10 reps Long Arc Quad: AAROM;Strengthening;Left;10 reps    PT Goals (current goals can now be found in the care plan section) Acute Rehab PT Goals PT Goal Formulation: With patient/family Time For Goal Achievement: 12/15/12 Potential to Achieve Goals: Good  Visit Information  Last PT Received On: 12/09/12 Assistance Needed: +1 PT/OT Co-Evaluation/Treatment: Yes (partial) History of Present Illness: Patient is a 47 yo male s/p Lt TKA..    Subjective Data      Cognition  Cognition Arousal/Alertness: Awake/alert Behavior During Therapy: WFL for tasks assessed/performed Overall Cognitive Status: Within Functional Limits for tasks assessed    Balance     End of Session PT - End of Session Equipment Utilized During Treatment: Gait belt Activity Tolerance: Patient tolerated treatment well Patient left: in chair;with call bell/phone within reach;with family/visitor present Nurse Communication: Mobility status   GP     Lara Mulch 12/09/2012, 9:56 AM  Verdell Face, PTA [phone] 12/09/2012

## 2012-12-09 NOTE — Care Management Note (Signed)
Case Manager spoke with patient and wife concerning home health and equipment needs at discharge. Patient has own walker, CPM to be delivered to the home by [employer]. Patient was preoperatively setup with [facility], no changes.

## 2012-12-09 NOTE — Progress Notes (Signed)
SPORTS MEDICINE AND JOINT REPLACEMENT  Georgena Spurling, MD   Altamese Cabal, PA-C [address] [place], [place], [place]  [address]                             [phone]   PROGRESS NOTE  Subjective:  negative for Chest Pain  negative for Shortness of Breath  negative for Nausea/Vomiting   negative for Calf Pain  negative for Bowel Movement   Tolerating Diet: yes         Patient reports pain as 4 on 0-10 scale.    Objective: Vital signs in last 24 hours:   Patient Vitals for the past 24 hrs:  BP Temp Temp src Pulse Resp SpO2  12/09/12 0500 140/72 mmHg 99.6 F (37.6 C) Oral 66 19 98 %  12/09/12 0106 156/74 mmHg 100.9 F (38.3 C) Oral 85 18 99 %  12/09/12 0000 - - - - 18 99 %  12/08/12 2116 148/73 mmHg 100.2 F (37.9 C) Oral 76 18 98 %  12/08/12 2000 - - - - 18 98 %  12/08/12 1150 154/73 mmHg 98.7 F (37.1 C) Oral 86 16 98 %  12/08/12 1122 - 97.1 F (36.2 C) - - - -  12/08/12 1117 144/63 mmHg - - 72 8 100 %  12/08/12 1102 158/73 mmHg - - 85 14 100 %  12/08/12 1047 148/63 mmHg - - 84 13 100 %  12/08/12 1032 168/74 mmHg - - 90 16 100 %  12/08/12 1018 155/69 mmHg - - 84 20 97 %  12/08/12 1002 122/103 mmHg - - 102 20 97 %  12/08/12 1000 - 98.9 F (37.2 C) - - - -    @flow {1959:LAST@   Intake/Output from previous day:   12/01 0701 - 12/02 0700 In: 3330 [P.O.:480; I.V.:2850] Out: 2045 [Urine:1070; Drains:925]   Intake/Output this shift:       Intake/Output     12/01 0701 - 12/02 0700 12/02 0701 - 12/03 0700   P.O. 480    I.V. 2850    Total Intake 3330     Urine 1070    Drains 925    Blood 50    Total Output 2045     Net +1285             LABORATORY DATA:  Recent Labs  12/08/12 1305 12/09/12 0442  WBC 14.1* 12.0*  HGB 12.9* 11.9*  HCT 36.9* 34.9*  PLT 233 203    Recent Labs  12/08/12 1305 12/09/12 0442  NA  --  139  K  --  4.0  CL  --  104  CO2  --  24  BUN  --  10  CREATININE 0.92 0.94  GLUCOSE  --  113*  CALCIUM  --  8.2*   Lab  Results  Component Value Date   INR 0.98 11/27/2012    Examination:  General appearance: alert, cooperative and no distress Extremities: Homans sign is negative, no sign of DVT  Wound Exam: clean, dry, intact   Drainage:  Scant/small amount Serosanguinous exudate  Motor Exam: EHL and FHL Intact  Sensory Exam: Deep Peroneal normal   Assessment:    1 Day Post-Op  Procedure(s) (LRB): TOTAL KNEE ARTHROPLASTY (Left)  ADDITIONAL DIAGNOSIS:  Active Problems:   S/P total knee arthroplasty  Acute Blood Loss Anemia   Plan: Physical Therapy as ordered Weight Bearing as Tolerated (WBAT)  DVT Prophylaxis:  Lovenox  DISCHARGE  PLAN: Home  DISCHARGE NEEDS: HHPT, CPM, Walker and 3-in-1 comode seat  Hemovax came out minutes before me rounding this morning.  Dressing was changed by me         Philip James 12/09/2012, 8:03 AM

## 2012-12-10 ENCOUNTER — Encounter (HOSPITAL_COMMUNITY): Payer: Self-pay | Admitting: Orthopedic Surgery

## 2012-12-10 NOTE — Op Note (Signed)
TOTAL KNEE REPLACEMENT OPERATIVE NOTE:  12/08/2012  7:07 AM  PATIENT:  Philip James  47 y.o. male  PRE-OPERATIVE DIAGNOSIS:  osteoarthritis left knee  James-OPERATIVE DIAGNOSIS:  osteoarthritis left knee  PROCEDURE:  Procedure(s): TOTAL KNEE ARTHROPLASTY  SURGEON:  Surgeon(s): Dannielle Huh, MD  PHYSICIAN ASSISTANT: Altamese Cabal, [facility]  ANESTHESIA:   general  DRAINS: Hemovac  SPECIMEN: None  COUNTS:  Correct  TOURNIQUET:   Total Tourniquet Time Documented: Thigh (Left) - 86 minutes Total: Thigh (Left) - 86 minutes   DICTATION:  Indication for procedure:    The patient is a 47 y.o. male who has failed conservative treatment for osteoarthritis left knee.  Informed consent was obtained prior to anesthesia. The risks versus benefits of the operation were explain and in a way the patient can, and did, understand.   On the implant demand matching protocol, this patient scored 15.  Therefore, this patient was receive a polyethylene insert with vitamin E which is a high demand implant.  Description of procedure:     The patient was taken to the operating room and placed under anesthesia.  The patient was positioned in the usual fashion taking care that all body parts were adequately padded and/or protected.  I foley catheter was not placed.  A tourniquet was applied and the leg prepped and draped in the usual sterile fashion.  The extremity was exsanguinated with the esmarch and tourniquet inflated to 350 mmHg.  Pre-operative range of motion was normal.  The knee was in 5 degree of mild varus.  A midline incision approximately 6-7 inches long was made with a #10 blade.  A new blade was used to make a parapatellar arthrotomy going 2-3 cm into the quadriceps tendon, over the patella, and alongside the medial aspect of the patellar tendon.  A synovectomy was then performed with the #10 blade and forceps. I then elevated the deep MCL off the medial tibial metaphysis subperiosteally  around to the semimembranosus attachment.    I everted the patella and used calipers to measure patellar thickness.  I used the reamer to ream down to appropriate thickness to recreate the native thickness.  I then removed excess bone with the rongeur and sagittal saw.  I used the appropriately sized template and drilled the three lug holes.  I then put the trial in place and measured the thickness with the calipers to ensure recreation of the native thickness.  The trial was then removed and the patella subluxed and the knee brought into flexion.  A homan retractor was place to retract and protect the patella and lateral structures.  A Z-retractor was place medially to protect the medial structures.  The extra-medullary alignment system was used to make cut the tibial articular surface perpendicular to the anamotic axis of the tibia and in 3 degrees of posterior slope.  The cut surface and alignment jig was removed.  I then used the intramedullary alignment guide to make a 6 valgus cut on the distal femur.  I then marked out the epicondylar axis on the distal femur.  The posterior condylar axis measured 3 degrees.  I then used the anterior referencing sizer and measured the femur to be a size 11.  The 4-In-1 cutting block was screwed into place in external rotation matching the posterior condylar angle, making our cuts perpendicular to the epicondylar axis.  Anterior, posterior and chamfer cuts were made with the sagittal saw.  The cutting block and cut pieces were removed.  A lamina  spreader was placed in 90 degrees of flexion.  The ACL, PCL, menisci, and posterior condylar osteophytes were removed.  A 10 mm spacer blocked was found to offer good flexion and extension gap balance after minimal in degree releasing.   The scoop retractor was then placed and the femoral finishing block was pinned in place.  The small sagittal saw was used as well as the lug drill to finish the femur.  The block and cut  surfaces were removed and the medullary canal hole filled with autograft bone from the cut pieces.  The tibia was delivered forward in deep flexion and external rotation.  A size G tray was selected and pinned into place centered on the medial 1/3 of the tibial tubercle.  The reamer and keel was used to prepare the tibia through the tray.    I then trialed with the size 11 femur, size G tibia, a 10 mm insert and the 38 patella.  I had excellent flexion/extension gap balance, excellent patella tracking.  Flexion was full and beyond 120 degrees; extension was zero.  These components were chosen and the staff opened them to me on the back table while the knee was lavaged copiously and the cement mixed.  The soft tissue was infiltrated with 60cc of exparel 1.3% through a 21 gauge needle.  I cemented in the components and removed all excess cement.  The polyethylene tibial component was snapped into place and the knee placed in extension while cement was hardening.  The capsule was infilltrated with 30cc of .25% Marcaine with epinephrine.  A hemovac was place in the joint exiting superolaterally.  A pain pump was place superomedially superficial to the arthrotomy.  Once the cement was hard, the tourniquet was let down.  Hemostasis was obtained.  The arthrotomy was closed with figure-8 #1 vicryl sutures.  The deep soft tissues were closed with #0 vicryls and the subcuticular layer closed with a running #2-0 vicryl.  The skin was reapproximated and closed with skin staples.  The wound was dressed with xeroform, 4 x4's, 2 ABD sponges, a single layer of webril and a TED stocking.   The patient was then awakened, extubated, and taken to the recovery room in stable condition.  BLOOD LOSS:  300cc DRAINS: 1 hemovac, 1 pain catheter COMPLICATIONS:  None.  PLAN OF CARE: Admit to inpatient   PATIENT DISPOSITION:  PACU - hemodynamically stable.   Delay start of Pharmacological VTE agent (>24hrs) due to surgical  blood loss or risk of bleeding:  not applicable  Please fax a copy of this op note to my office at [phone] (please only include page 1 and 2 of the Case Information op note)

## 2012-12-15 NOTE — Discharge Summary (Signed)
SPORTS MEDICINE & JOINT REPLACEMENT   Georgena Spurling, MD   Altamese Cabal, PA-C [address] [place], [place], [place]  [address]                             [phone]  PATIENT ID: Philip James        MRN:  [id number]          DOB/AGE: April 01, 1965 / 47 y.o.    DISCHARGE SUMMARY  ADMISSION DATE:    12/08/2012 DISCHARGE DATE:  12/09/2012  ADMISSION DIAGNOSIS: osteoarthritis left knee    DISCHARGE DIAGNOSIS:  osteoarthritis left knee    ADDITIONAL DIAGNOSIS: Active Problems:   S/P total knee arthroplasty  Past Medical History  Diagnosis Date  . Cough     developed a cough after getting a flu shot  . Arthritis     PROCEDURE: Procedure(s): TOTAL KNEE ARTHROPLASTY on 12/08/2012  CONSULTS:     HISTORY:  See H&P in chart  HOSPITAL COURSE:  Philip James is a 47 y.o. admitted on 12/08/2012 and found to have a diagnosis of osteoarthritis left knee.  After appropriate laboratory studies were obtained  they were taken to the operating room on 12/08/2012 and underwent Procedure(s): TOTAL KNEE ARTHROPLASTY.   They were given perioperative antibiotics:  Anti-infectives   Start     Dose/Rate Route Frequency Ordered Stop   12/08/12 1300  ceFAZolin (ANCEF) IVPB 2 g/50 mL premix     2 g 100 mL/hr over 30 Minutes Intravenous Every 6 hours 12/08/12 1210 12/08/12 1921   12/08/12 0605  ceFAZolin (ANCEF) 2-3 GM-% IVPB SOLR  Status:  Discontinued    Comments:  Dia Crawford   : cabinet override      12/08/12 0605 12/08/12 0735   12/08/12 0600  ceFAZolin (ANCEF) IVPB 2 g/50 mL premix     2 g 100 mL/hr over 30 Minutes Intravenous On call to O.R. 12/07/12 1447 12/08/12 0740    .  Tolerated the procedure well.  Placed with a foley intraoperatively.  Given Ofirmev at induction and for 48 hours.    POD# 1: Vital signs were stable.  Patient denied Chest pain, shortness of breath, or calf pain.  Patient was started on Lovenox 30 mg subcutaneously twice daily at 8am.  Consults to PT, OT,  and care management were made.  The patient was weight bearing as tolerated.  CPM was placed on the operative leg 0-90 degrees for 6-8 hours a day.  Incentive spirometry was taught.  Dressing was changed.  Marcaine pump and hemovac were discontinued.      POD #2, Continued  PT for ambulation and exercise program.  IV saline locked.  O2 discontinued.    The remainder of the hospital course was dedicated to ambulation and strengthening.   The patient was discharged on 1 day post op in  Good condition.  Blood products given:none  DIAGNOSTIC STUDIES: Recent vital signs: No data found.      Recent laboratory studies:  Recent Labs  12/08/12 1305 12/09/12 0442  WBC 14.1* 12.0*  HGB 12.9* 11.9*  HCT 36.9* 34.9*  PLT 233 203    Recent Labs  12/08/12 1305 12/09/12 0442  NA  --  139  K  --  4.0  CL  --  104  CO2  --  24  BUN  --  10  CREATININE 0.92 0.94  GLUCOSE  --  113*  CALCIUM  --  8.2*   Lab Results  Component Value Date   INR 0.98 11/27/2012     Recent Radiographic Studies :  Dg Chest 2 View  11/27/2012   CLINICAL DATA:  Left total knee arthroplasty, cough.  EXAM: CHEST  2 VIEW  COMPARISON:  None.  FINDINGS: The heart size and mediastinal contours are within normal limits. Both lungs are clear. The visualized skeletal structures are unremarkable.  IMPRESSION: No active cardiopulmonary disease.   Electronically Signed   By: Elige Ko   On: 11/27/2012 14:57    DISCHARGE INSTRUCTIONS: Discharge Orders   Future Orders Complete By Expires   Call MD / Call 911  As directed    Comments:     If you experience chest pain or shortness of breath, CALL 911 and be transported to the hospital emergency room.  If you develope a fever above 101 F, pus (white drainage) or increased drainage or redness at the wound, or calf pain, call your surgeon's office.   Change dressing  As directed    Comments:     Change dressing on Wednesday, then change the dressing daily with sterile  4 x 4 inch gauze dressing and apply TED hose.   Constipation Prevention  As directed    Comments:     Drink plenty of fluids.  Prune juice may be helpful.  You may use a stool softener, such as Colace (over the counter) 100 mg twice a day.  Use MiraLax (over the counter) for constipation as needed.   CPM  As directed    Comments:     Continuous passive motion machine (CPM):      Use the CPM from 0 to 90 for 6-8 hours per day.      You may increase by 10 per day.  You may break it up into 2 or 3 sessions per day.      Use CPM for 2 weeks or until you are told to stop.   Diet - low sodium heart healthy  As directed    Do not put a pillow under the knee. Place it under the heel.  As directed    Driving restrictions  As directed    Comments:     No driving for 6 weeks   Increase activity slowly as tolerated  As directed    Lifting restrictions  As directed    Comments:     No lifting for 6 weeks   TED hose  As directed    Comments:     Use stockings (TED hose) for 3 weeks on both leg(s).  You may remove them at night for sleeping.      DISCHARGE MEDICATIONS:     Medication List         ANDROGEL PUMP 20.25 MG/ACT (1.62%) Gel  Generic drug:  Testosterone  Place 20.25 mg onto the skin daily.     enoxaparin 40 MG/0.4ML injection  Commonly known as:  LOVENOX  Inject 0.4 mLs (40 mg total) into the skin daily.     GNP MEGA MULTI FOR MEN Tabs  Take 8 tablets by mouth daily. (multi vitamin pack)     methocarbamol 500 MG tablet  Commonly known as:  ROBAXIN  Take 1-2 tablets (500-1,000 mg total) by mouth every 6 (six) hours as needed for muscle spasms.     oxyCODONE 5 MG immediate release tablet  Commonly known as:  Oxy IR/ROXICODONE  Take 1-2 tablets (5-10 mg total) by mouth every 3 (three)  hours as needed for breakthrough pain.     OxyCODONE 10 mg T12a 12 hr tablet  Commonly known as:  OXYCONTIN  Take 1 tablet (10 mg total) by mouth every 12 (twelve) hours.     tadalafil 5 MG  tablet  Commonly known as:  CIALIS  Take 2.5 mg by mouth daily as needed for erectile dysfunction.        FOLLOW UP VISIT:       Follow-up Information   Follow up with Raymon Mutton, MD. Call on 12/23/2012.   Specialty:  Orthopedic Surgery   Contact information:   [address] [place] [address] [phone]       DISPOSITION: HOME   CONDITION:  Good   Teniyah Seivert 12/15/2012, 9:18 AM

## 2015-08-16 IMAGING — CR DG CHEST 2V
2 series · 2 of 2 positions shown · non-contrast
Comparison: None.

CLINICAL DATA: Left total knee arthroplasty, cough.

EXAM:
CHEST  2 VIEW

[w chest pa]
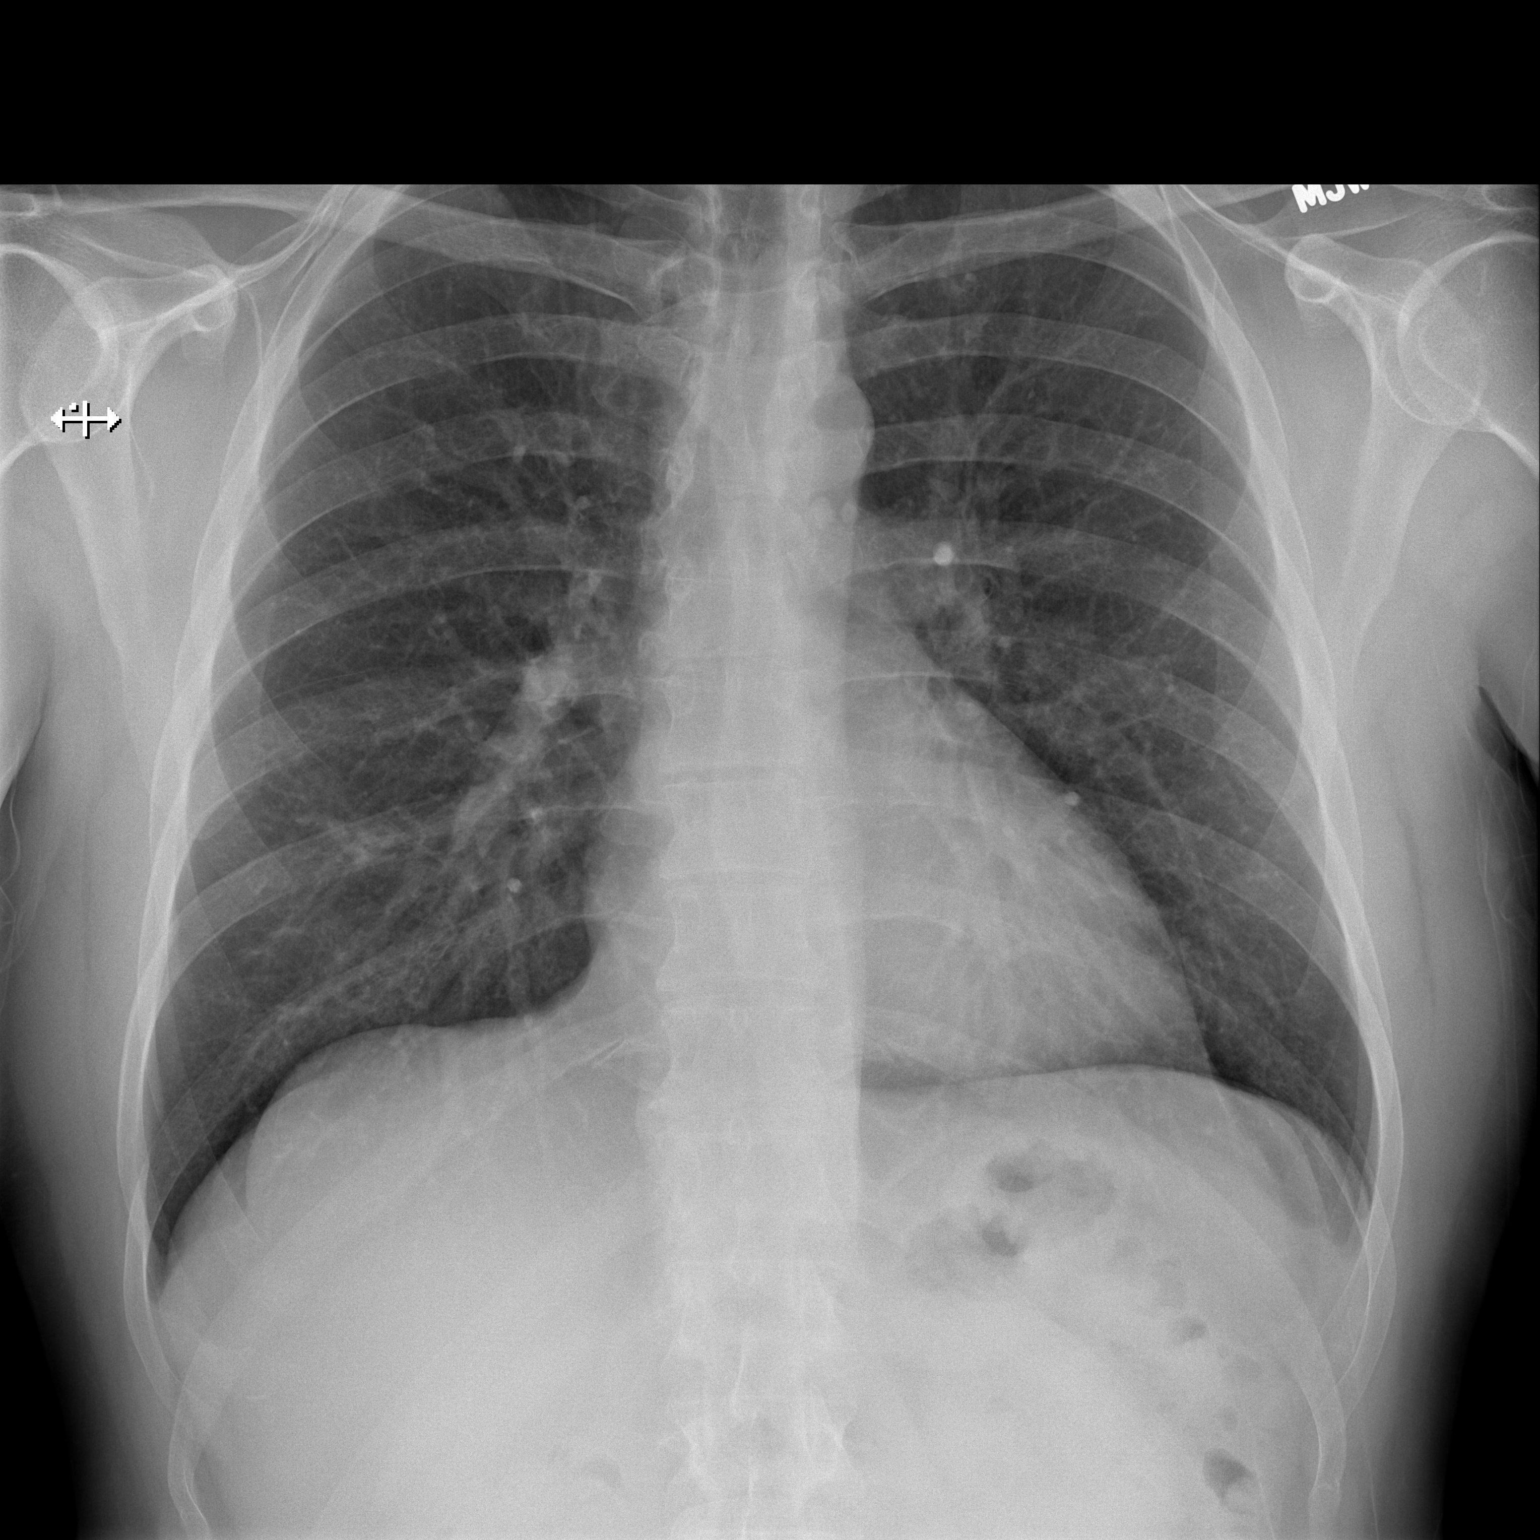

[w chest lat]
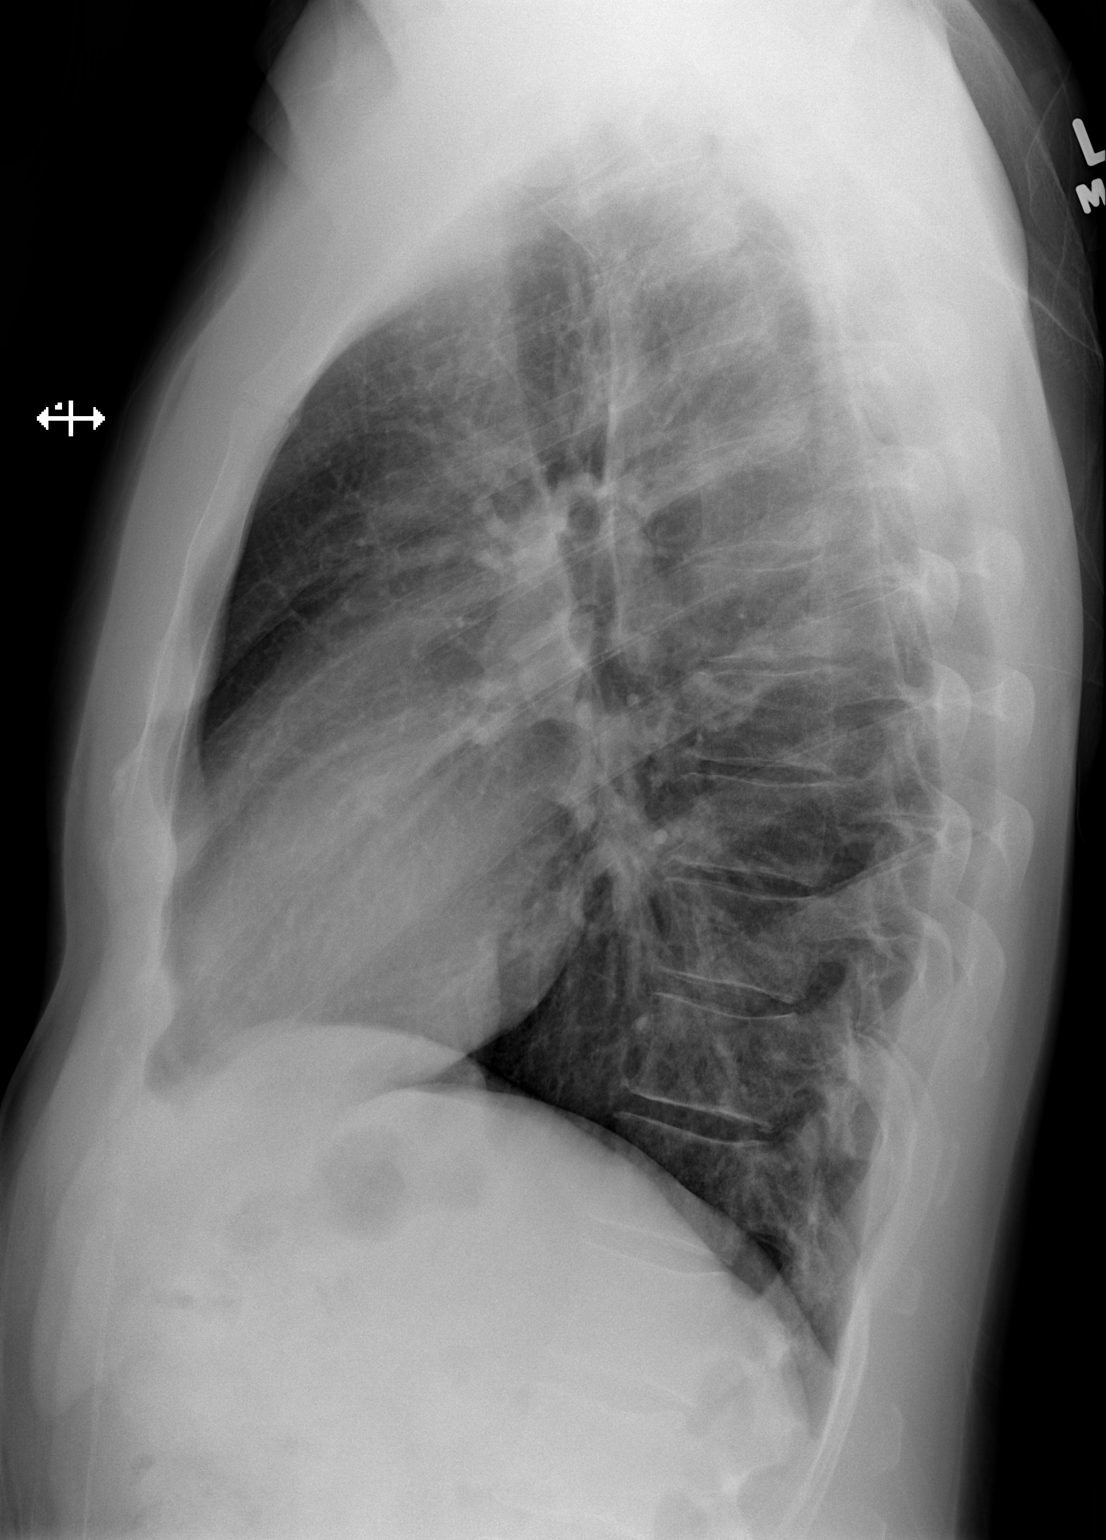

[2 of 2 positions shown; findings below may reference images not displayed]

FINDINGS: The heart size and mediastinal contours are within normal limits.
Both lungs are clear. The visualized skeletal structures are
unremarkable.
IMPRESSION: No active cardiopulmonary disease.

## 2015-09-08 ENCOUNTER — Ambulatory Visit: Payer: 59 | Attending: Orthopedic Surgery | Admitting: Physical Therapy

## 2015-09-08 DIAGNOSIS — R252 Cramp and spasm: Secondary | ICD-10-CM | POA: Insufficient documentation

## 2015-09-08 DIAGNOSIS — M25662 Stiffness of left knee, not elsewhere classified: Secondary | ICD-10-CM | POA: Diagnosis present

## 2015-09-08 DIAGNOSIS — M25562 Pain in left knee: Secondary | ICD-10-CM | POA: Diagnosis present

## 2015-09-08 NOTE — Therapy (Addendum)
[facility] [address] [place], [place], [address] Phone: [phone]   Fax:  [phone]  Physical Therapy Evaluation  Patient Details  Name: Philip James MRN: [id number] Date of Birth: Mar 07, 1965 Referring Provider: Dannielle Huh MD  Encounter Date: 09/08/2015      PT End of Session - 09/08/15 1250    Visit Number 1   Number of Visits 12   Date for PT Re-Evaluation 10/20/15   Authorization Type U[employer]   Authorization Time Period 10-20-15   PT Start Time 1145   PT Stop Time 1245   PT Time Calculation (min) 60 min   Activity Tolerance Patient tolerated treatment well   Behavior During Therapy [facility] for tasks assessed/performed      Past Medical History:  Diagnosis Date  . Arthritis   . Cough    developed a cough after getting a flu shot    Past Surgical History:  Procedure Laterality Date  . BICEPS TENDON REPAIR Right 2005  . KNEE ARTHROSCOPY Right 2014  . KNEE ARTHROSCOPY Left    x8 cartilage repair, x3 ACL most recent 3 years ago  . TOTAL KNEE ARTHROPLASTY Left 12/08/2012   Dr Sherlean Foot  . TOTAL KNEE ARTHROPLASTY Left 12/08/2012   Procedure: TOTAL KNEE ARTHROPLASTY;  Surgeon: Dannielle Huh, MD;  Location: [facility] OR;  Service: Orthopedics;  Laterality: Left;    There were no vitals filed for this visit.       Subjective Assessment - 09/08/15 1155    Subjective Old TKA on left in 2014 , has chronic pain in left lateral knee post 13 surgeries as per pt report  with one debridement of scar tissue recnetly in 01-12-15 through left knee arthroscopy.  He says he is able to walk 5-6 miles but he cannot play softball or ski or even simply transtion from bed to chair without pain. 8-10/10.  Pt has specific pain points and is wanting to try trigger point dry needling.   Pertinent History 2014 Left TKA, Arthroscopy 01-12-15   How long can you sit comfortably? unlimited but rising from chair significant pain   How long can you stand  comfortably? depends on if I am going up and down steps.    How long can you walk comfortably? 5-6 miles but pain afterwards   Diagnostic tests xray, MRI   Patient Stated Goals Get rid of chronic pain, transition movements, sleep without waking at night.       Currently in Pain? Yes   Pain Score 10-Worst pain ever  0/10 rest but moving while sleepin 7/10   Pain Location Knee   Pain Orientation Left   Pain Descriptors / Indicators Sharp;Tightness   Pain Type Chronic pain   Pain Onset More than a month ago   Pain Frequency Intermittent   Aggravating Factors  stairs , moving in bed, rising from chair.    Pain Relieving Factors none            [facility] PT Assessment - 09/08/15 1147      Assessment   Medical Diagnosis Chronic pain post L TKR   Referring Provider Dannielle Huh MD   Onset Date/Surgical Date 01/12/15  TKR in 2014, 1-4 was scar debridement   Prior Therapy yes , Dr. Sherlean Foot and TKR with [facility] [facility]     Precautions   Precautions None     Restrictions   Weight Bearing Restrictions No     Balance Screen   Has the patient fallen in  the past 6 months No   Has the patient had a decrease in activity level because of a fear of falling?  No   Is the patient reluctant to leave their home because of a fear of falling?  No     Home [employer] manag[employer]vironment   Living Environment Private residence   Living Arrangements Spouse/significant other;Children   Type of Home House   Home Layout Two level     Prior Function   Level of Independence Independent     Cognition   Overall Cognitive Status Within Functional Limits for tasks assessed     Observation/Other Assessments   Focus on Therapeutic Outcomes (FOTO)  FOTO Intake 39% limitation 61% predicted 42%     Observation/Other Assessments-Edema    Edema Circumferential     Circumferential Edema   Circumferential - Right 38 cm   Circumferential - Left  41 cm     Posture/Postural Control   Posture/Postural Control Postural  limitations   Postural Limitations Rounded Shoulders;Forward head;Anterior pelvic tilt     ROM / Strength   AROM / PROM / Strength AROM;PROM;Strength     AROM   Right Knee Extension 0   Right Knee Flexion 135   Left Knee Extension 3   Left Knee Flexion 115     PROM   Right Knee Extension 0   Right Knee Flexion 140   Left Knee Extension 0   Left Knee Flexion 118  pain limiting     Strength   Overall Strength Within functional limits for tasks performed   Overall Strength Comments grossly 4+/5 to 5/5     Flexibility   Hamstrings Right 72, Left 68   Quadriceps right 118 Left 93   prone     Palpation   Palpation comment tenderness over supra patellar but more marked over lateral patellar adn infrapatellar over distal end of IT band.  some tendernessover lateral bicaps femoris     [employer] Test    Findings Positive   Side Left   Comments Pt with quad tightness and hip flexor tightness.     Patellar mobility- Left knee with limitations superiorly > than inferiorly.  Good excursion medially and laterally              [facility] Adult PT Treatment/Exercise - 09/08/15 1147      Modalities   Modalities Moist Heat     Moist Heat Therapy   Number Minutes Moist Heat 15 Minutes   Moist Heat Location Knee  left knee     Manual Therapy   Manual Therapy Soft tissue mobilization   Soft tissue mobilization IASTYM left knee quad Vastus lateralis and rectus femoris          Trigger Point Dry Needling - 09/08/15 1436    Consent Given? Yes   Education Handout Provided Yes   Muscles Treated Lower Body Quadriceps  left Vastus Lateralis and rectus femoris   Quadriceps Response Twitch response elicited;Palpable increased muscle length              PT Education - 09/08/15 1244    Education provided Yes   Education Details POC, Explanation of findings,  precautians for Trigger point dry needling , aftercare   Person(s) Educated Patient   Methods Explanation;Handout    Comprehension Verbalized understanding             PT Long Term Goals - 09/08/15 1249      PT LONG TERM GOAL #1   Title "Pt will  be independent with advanced HEP   Time 6   Period Weeks   Status New     PT LONG TERM GOAL #2   Title Pt will be able to demonstrate increase functional AROM of knee flexion with squat on left 120 or better   Time 6   Period Weeks   Status New     PT LONG TERM GOAL #3   Title "Pain will decrease to 3/10 or below with all functional activities   Time 6   Period Weeks   Status New     PT LONG TERM GOAL #4   Title "FOTO will improve from 61% limtation   to 42 %limtation    indicating improved functional mobility   Time 6   Period Weeks   Status New     PT LONG TERM GOAL #5   Title Pt to be able to verbalize pain management techniques    Time 6   Period Weeks   Status New     Additional Long Term Goals   Additional Long Term Goals Yes     PT LONG TERM GOAL #6   Title Pt will be able to hike on uneven trails for 1 mile without exacerbating pain   Time 6   Period Weeks   Status New               Plan - 09/08/15 1245    Clinical Impression Statement 50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 Pt underwent arthroscopy for scar debridement.  Pt reports he still has residual staples in femur deeply embedded and unable to be removed.   Some tenderness also noted on left lateral biceps femoris.  Pt also with tight ness in quadriceps in prone to AROM 93 degrees.  In supine Left knee AROM  3 - 115, PROM 0-120 after treatment.  Pt with limtation with activity in transitional movement from sit to stand and rolling bed with pain as high as 10/10,  Pt has Left TKR in 2014 with PT at [facility] and most  recently iontophoresis at Dr. Sherlean Foot and PT [facility] Glance [facility] without much improvement in pain.  Pt presents today and consents to trigger point dry needling for pain and to increase flexibility in areas of fascial restriction.  Pt is willing to try any modality treatment in order to provide any relief from ongoing intermittent pain    Rehab Potential Good   PT Frequency 2x / week   PT Duration 6 weeks   PT Treatment/Interventions ADLs/Self Care Home Management;Cryotherapy;Electrical Stimulation;Iontophoresis 4mg /ml Dexamethasone;Gait training;Stair training;Functional mobility training;Ultrasound;Moist Heat;Therapeutic exercise;Neuromuscular re-education;Patient/family education;Passive range of motion;Scar mobilization;Manual techniques;Dry needling;Taping   PT Next Visit Plan assess dry needling, give Initial HEP, possible hamstring and e stim dry needling, check popliteus muscle   PT Home Exercise Plan aftercare for trigger point dry needling   Consulted and Agree with Plan of Care Patient      Patient will benefit from skilled therapeutic intervention in order to improve the following deficits and impairments:  Pain, Postural dysfunction, Improper body mechanics, Impaired flexibility, Increased fascial restricitons, Decreased strength, Decreased range of motion, Decreased activity tolerance  Visit Diagnosis: Pain in left knee  Cramp and spasm  Stiffness of left knee, not elsewhere classified  Problem List Patient Active Problem List   Diagnosis Date Noted  . S/P total knee arthroplasty 12/08/2012   Garen Lah, PT 09/08/15 3:10 PM Phone: [phone] Fax: [phone] By signing I understand that I am ordering/authorizing the use of Iontophoresis using 4 mg/mL of dexamethasone as a component of this plan of care.  [facility] [address] [place], [place], [address] Phone: [phone]   Fax:  [phone]  Name:  Philip James MRN: [id number] Date of Birth: 12/12/1965

## 2015-09-08 NOTE — Patient Instructions (Signed)
Trigger Point Dry Needling  . What is Trigger Point Dry Needling (DN)? o DN is a physical therapy technique used to treat muscle pain and dysfunction. Specifically, DN helps deactivate muscle trigger points (muscle knots).  o A thin filiform needle is used to penetrate the skin and stimulate the underlying trigger point. The goal is for a local twitch response (LTR) to occur and for the trigger point to relax. No medication of any kind is injected during the procedure.   . What Does Trigger Point Dry Needling Feel Like?  o The procedure feels different for each individual patient. Some patients report that they do not actually feel the needle enter the skin and overall the process is not painful. Very mild bleeding may occur. However, many patients feel a deep cramping in the muscle in which the needle was inserted. This is the local twitch response.   Marland Kitchen. How Will I feel after the treatment? o Soreness is normal, and the onset of soreness may not occur for a few hours. Typically this soreness does not last longer than two days.  o Bruising is uncommon, however; ice can be used to decrease any possible bruising.  o In rare cases feeling tired or nauseous after the treatment is normal. In addition, your symptoms may get worse before they get better, this period will typically not last longer than 24 hours.   . What Can I do After My Treatment? o Increase your hydration by drinking more water for the next 24 hours. o You may place ice or heat on the areas treated that have become sore, however, do not use heat on inflamed or bruised areas. Heat often brings more relief post needling. o You can continue your regular activities, but vigorous activity is not recommended initially after the treatment for 24 hours. o DN is best combined with other physical therapy such as strengthening, stretching, and other therapies.    Garen LahLawrie Margart Zemanek, PT 09/08/15 12:16 PM Phone: [phone] Fax: [phone]

## 2015-09-13 ENCOUNTER — Ambulatory Visit: Payer: 59 | Attending: Orthopedic Surgery | Admitting: Physical Therapy

## 2015-09-13 DIAGNOSIS — M25662 Stiffness of left knee, not elsewhere classified: Secondary | ICD-10-CM

## 2015-09-13 DIAGNOSIS — R252 Cramp and spasm: Secondary | ICD-10-CM | POA: Insufficient documentation

## 2015-09-13 DIAGNOSIS — M25562 Pain in left knee: Secondary | ICD-10-CM | POA: Diagnosis not present

## 2015-09-13 NOTE — Therapy (Addendum)
[facility] [address] [place], [place], [address] Phone: [phone]   Fax:  [phone]  Physical Therapy Treatment  Patient Details  Name: Philip James MRN: [id number] Date of Birth: 1965-11-29 Referring Provider: Dannielle Huh MD  Encounter Date: 09/13/2015      PT End of Session - 09/13/15 1315    Visit Number 2   Number of Visits 12   Date for PT Re-Evaluation 10/20/15   Authorization Type UHC   Authorization Time Period 10-20-15   PT Start Time 1145   PT Stop Time 1250   PT Time Calculation (min) 65 min   Activity Tolerance Patient tolerated treatment well   Behavior During Therapy [facility] for tasks assessed/performed      Past Medical History:  Diagnosis Date  . Arthritis   . Cough    developed a cough after getting a flu shot    Past Surgical History:  Procedure Laterality Date  . BICEPS TENDON REPAIR Right 2005  . KNEE ARTHROSCOPY Right 2014  . KNEE ARTHROSCOPY Left    x8 cartilage repair, x3 ACL most recent 3 years ago  . TOTAL KNEE ARTHROPLASTY Left 12/08/2012   Dr Sherlean Foot  . TOTAL KNEE ARTHROPLASTY Left 12/08/2012   Procedure: TOTAL KNEE ARTHROPLASTY;  Surgeon: Dannielle Huh, MD;  Location: [facility] OR;  Service: Orthopedics;  Laterality: Left;    There were no vitals filed for this visit.      Subjective Assessment - 09/13/15 1155    Subjective I went to the chiropractor this morning at [facility].  I tried to lift my leg when I was sitting and it hurt.  I can hike and bike . It is hard for me to get of bed while trying to bend left knee and maintain elevation as I get out of bed.     Pertinent History 2014 Left TKA, Arthroscopy 01-12-15   Patient Stated Goals Get rid of chronic pain, transition movements, sleep without waking at night.       Pain Score 2   shoots to an 8/10 with knee flexion and straightening.   Pain Location Knee   Pain Orientation Left   Pain Descriptors / Indicators  Sharp;Tightness   Pain Type Chronic pain   Pain Onset More than a month ago   Pain Frequency Intermittent            [facility] PT Assessment - 09/13/15 1315      AROM   Left Knee Extension 3   Left Knee Flexion 120  before TDN                     [facility] Adult PT Treatment/Exercise - 09/13/15 1310      Self-Care   Self-Care Other Self-Care Comments   Other Self-Care Comments  pain management strategies, postitioning     Modalities   Modalities Moist Heat     Moist Heat Therapy   Number Minutes Moist Heat 15 Minutes   Moist Heat Location Knee  hams, gastroc and quad and ant tibialis     Manual Therapy   Manual Therapy Joint mobilization;Soft tissue mobilization;Taping   Joint Mobilization AP mob grade 3/4 of fibular head, also mobs with IR/ER of tibial grade 3   Soft tissue mobilization IASTYM of left hamstring, Quads ( VL and RF) , tibiialis Ant and gastroc lateral       [employer]   [employer] compression star over lateral knee over superior fibualr head.  Trigger Point Dry needling of Left lateral hamstring, Quads Vastus Lateralis, left lateral gastroc and left peroneal longus and tibialis anterior.. Pt with ++Localized Twitch Response of peroneals and Hamstring, Quads. With palpable lengthenining.           PT Education - 09/13/15 1314    Education provided Yes   Education Details Explanation of TDN and showed studies of recent DRy needling post TKR in March [facility] 2017   Person(s) Educated Patient   Methods Explanation   Comprehension Verbalized understanding             PT Long Term Goals - 09/13/15 1326      PT LONG TERM GOAL #1   Title "Pt will be independent with advanced HEP   Time 6   Period Weeks   Status On-going     PT LONG TERM GOAL #2   Title Pt will be able to demonstrate increase functional AROM of knee flexion with squat on left 120 or better   Time 6   Period Weeks   Status On-going     PT LONG TERM GOAL #3    Title "Pain will decrease to 3/10 or below with all functional activities   Time 6   Period Weeks   Status On-going     PT LONG TERM GOAL #4   Title "FOTO will improve from 61% limtation   to 42 %limtation    indicating improved functional mobility   Time 6   Period Weeks   Status On-going     PT LONG TERM GOAL #5   Title Pt to be able to verbalize pain management techniques    Baseline instructed initially   Time 6   Period Weeks   Status On-going     PT LONG TERM GOAL #6   Title Pt will be able to hike on uneven trails for 1 mile without exacerbating pain   Time 6   Period Weeks   Status On-going               Plan - 09/13/15 1315    Clinical Impression Statement Pt returns to clinic with minimal soreness from trigger point dry needling.  Pt is active with hiking and biking.  His main concern is sitting after 15 minutes or rising from bed.  He considers having nerves cut due to constant irritating pain with normal transition movement.  However pt does not reproduce pain with lunges or  flexiong kneee unless in sidelying position. . Pt  seems to have incresed bending by patient report ost dry needling. Philip James tolerates dry needlign session well and consents to further dry needlling. Closely monitored through out session and significant localized twitch responses in left gastroc, anterior tibilalis, quads and lateral hamstring.  Will continiue trial of dry needling with Anne NgKris Leamon DPT next session.Marland Kitchen. and assess benefits of dry needling   Rehab Potential Good   PT Frequency 2x / week   PT Duration 6 weeks   PT Treatment/Interventions ADLs/Self Care Home Management;Cryotherapy;Electrical Stimulation;Iontophoresis 4mg /ml Dexamethasone;Gait training;Stair training;Functional mobility training;Ultrasound;Moist Heat;Therapeutic exercise;Neuromuscular re-education;Patient/family education;Passive range of motion;Scar mobilization;Manual techniques;Dry needling;Taping   PT Next  Visit Plan assess dry needling, give Initial HEP, possible hamstring and e stim dry needling, check fibular head placement and movement, Check for trendelenberg, check patellar tracking   PT Home Exercise Plan aftercare for trigger point dry needling   Consulted and Agree with Plan of Care Patient      Patient will benefit from skilled  therapeutic intervention in order to improve the following deficits and impairments:  Pain, Postural dysfunction, Improper body mechanics, Impaired flexibility, Increased fascial restricitons, Decreased strength, Decreased range of motion, Decreased activity tolerance  Visit Diagnosis: Pain in left knee  Cramp and spasm  Stiffness of left knee, not elsewhere classified     Problem List Patient Active Problem List   Diagnosis Date Noted  . S/P total knee arthroplasty 12/08/2012    Garen Lah, PT 09/13/15 1:28 PM Phone: [phone] Fax: [phone]  [facility] [address] [place], [place], [address] Phone: [phone]   Fax:  [phone]  Name: Philip James MRN: [id number] Date of Birth: [birth year]

## 2015-09-20 ENCOUNTER — Ambulatory Visit: Payer: 59 | Admitting: Physical Therapy

## 2015-09-20 DIAGNOSIS — M25562 Pain in left knee: Secondary | ICD-10-CM | POA: Diagnosis not present

## 2015-09-20 DIAGNOSIS — M25662 Stiffness of left knee, not elsewhere classified: Secondary | ICD-10-CM

## 2015-09-20 DIAGNOSIS — R252 Cramp and spasm: Secondary | ICD-10-CM

## 2015-09-20 NOTE — Therapy (Signed)
[facility] [address] [place], [place], [address] Phone: [phone]   Fax:  [phone]  Physical Therapy Treatment  Patient Details  Name: VENKAT ANKNEY MRN: [id number] Date of Birth: 1965/09/28 Referring Provider: Dannielle Huh MD  Encounter Date: 09/20/2015      PT End of Session - 09/20/15 1305    Visit Number 3   Number of Visits 12   Date for PT Re-Evaluation 10/20/15   Activity Tolerance Patient tolerated treatment well   Behavior During Therapy [facility] for tasks assessed/performed      Past Medical History:  Diagnosis Date  . Arthritis   . Cough    developed a cough after getting a flu shot    Past Surgical History:  Procedure Laterality Date  . BICEPS TENDON REPAIR Right 2005  . KNEE ARTHROSCOPY Right 2014  . KNEE ARTHROSCOPY Left    x8 cartilage repair, x3 ACL most recent 3 years ago  . TOTAL KNEE ARTHROPLASTY Left 12/08/2012   Dr Sherlean Foot  . TOTAL KNEE ARTHROPLASTY Left 12/08/2012   Procedure: TOTAL KNEE ARTHROPLASTY;  Surgeon: Dannielle Huh, MD;  [facility] OR;  Service: Orthopedics;  Laterality: Left;    There were no vitals filed for this visit.      Subjective Assessment - 09/20/15 1157    Subjective " The DN  helped for the first couple days but then the pain gradually came back"    Currently in Pain? Yes   Pain Score 8    Pain Location Knee   Pain Orientation Left   Pain Descriptors / Indicators Sharp;Tightness   Pain Onset More than a month ago   Pain Frequency Intermittent                         [facility] Adult PT Treatment/Exercise - 09/20/15 0001      Modalities   Modalities Electrical Stimulation     Electrical Stimulation   Electrical Stimulation Location proximal peroneal longus (below fibular head) and distal vastus laterlis   Electrical Stimulation Action pre-mod set-up with for DN   Electrical Stimulation Parameters frequency set at 10, vastus lateralis at 1/2  way to 2nd solid dot, and peroneal longus to 1st solid dot x 10 min  set for twitch to fatigue muscle   Electrical Stimulation Goals Pain     Manual Therapy   Manual Therapy Taping   Joint Mobilization AP mob grade 3/4 of fibular head, also mobs with IR/ER of tibial grade 3   McConnell Lateral to medial patellar taping with rotational correction pulling inferior pole medially          Trigger Point Dry Needling - 09/20/15 1301    Consent Given? Yes   Education Handout Provided Yes  provided previously   Muscles Treated Lower Body Tensor fascia lata  x 2 in proximal peroneal longus with pistoning    Tensor Fascia Lata Response Twitch response elicited;Palpable increased muscle length  x 2   Quadriceps Response Twitch response elicited;Palpable increased muscle length  x 6 with pistoning technique              PT Education - 09/20/15 1305    Education provided Yes   Education Details E-stim to fatigue the muscle to relieve pain and taping to correct rotation and medial sitting patella.    Person(s) Educated Patient   Methods Explanation;Verbal cues   Comprehension Verbalized understanding;Verbal cues required  PT Long Term Goals - 09/13/15 1326      PT LONG TERM GOAL #1   Title "Pt will be independent with advanced HEP   Time 6   Period Weeks   Status On-going     PT LONG TERM GOAL #2   Title Pt will be able to demonstrate increase functional AROM of knee flexion with squat on left 120 or better   Time 6   Period Weeks   Status On-going     PT LONG TERM GOAL #3   Title "Pain will decrease to 3/10 or below with all functional activities   Time 6   Period Weeks   Status On-going     PT LONG TERM GOAL #4   Title "FOTO will improve from 61% limtation   to 42 %limtation    indicating improved functional mobility   Time 6   Period Weeks   Status On-going     PT LONG TERM GOAL #5   Title Pt to be able to verbalize pain management techniques     Baseline instructed initially   Time 6   Period Weeks   Status On-going     PT LONG TERM GOAL #6   Title Pt will be able to hike on uneven trails for 1 mile without exacerbating pain   Time 6   Period Weeks   Status On-going               Plan - 09/20/15 1305    Clinical Impression Statement Mr. Aileen PilotBishop states relief only lasted 2 days following last session. DN performed on TFL, vastus lateralis, and peroneal longus with attended E-stim. Following perofrmed McConnel taping to correct lateral shifting and rotation which may be possible producing and impingment that is exacerbated with sidelying. following todays session pt was able to lying on his side with no report of pain. pt declined modalites post session.    PT Next Visit Plan assess dry needling, give Initial HEP, possible hamstring and e stim dry needling, check fibular head placement and movement, glute tightness? VMO activation   Consulted and Agree with Plan of Care Patient      Patient will benefit from skilled therapeutic intervention in order to improve the following deficits and impairments:  Pain, Postural dysfunction, Improper body mechanics, Impaired flexibility, Increased fascial restricitons, Decreased strength, Decreased range of motion, Decreased activity tolerance  Visit Diagnosis: Pain in left knee  Cramp and spasm  Stiffness of left knee, not elsewhere classified     Problem List Patient Active Problem List   Diagnosis Date Noted  . S/P total knee arthroplasty 12/08/2012   Lulu RidingKristoffer Iyania Denne PT, DPT, LAT, ATC  09/20/15  1:10 PM      [facility] [address] [place], [place], [address] Phone: [phone]   Fax:  [phone]  Name: Eulas PostWilliam L Cruey MRN: [id number] Date of Birth: 08/23/1965

## 2015-09-22 ENCOUNTER — Ambulatory Visit: Payer: 59 | Admitting: Physical Therapy

## 2015-09-22 DIAGNOSIS — M25562 Pain in left knee: Secondary | ICD-10-CM

## 2015-09-22 DIAGNOSIS — M25662 Stiffness of left knee, not elsewhere classified: Secondary | ICD-10-CM

## 2015-09-22 DIAGNOSIS — R252 Cramp and spasm: Secondary | ICD-10-CM

## 2015-09-22 NOTE — Therapy (Signed)
[facility] [address] [place], [place], [address] Phone: [phone]   Fax:  [phone]  Physical Therapy Treatment  Patient Details  Name: Philip James MRN: [id number] Date of Birth: 1965-01-14 Referring Provider: Dannielle HuhLucey, Steve MD  Encounter Date: 09/22/2015      PT End of Session - 09/22/15 1223    Visit Number 4   Number of Visits 12   Date for PT Re-Evaluation 10/20/15   PT Start Time 1108   PT Stop Time 1203   PT Time Calculation (min) 55 min   Activity Tolerance Patient tolerated treatment well   Behavior During Therapy [facility] for tasks assessed/performed      Past Medical History:  Diagnosis Date  . Arthritis   . Cough    developed a cough after getting a flu shot    Past Surgical History:  Procedure Laterality Date  . BICEPS TENDON REPAIR Right 2005  . KNEE ARTHROSCOPY Right 2014  . KNEE ARTHROSCOPY Left    x8 cartilage repair, x3 ACL most recent 3 years ago  . TOTAL KNEE ARTHROPLASTY Left 12/08/2012   Dr Sherlean FootLucey  . TOTAL KNEE ARTHROPLASTY Left 12/08/2012   Procedure: TOTAL KNEE ARTHROPLASTY;  Surgeon: Dannielle HuhSteve Lucey, MD;  [facility] OR;  Service: Orthopedics;  Laterality: Left;    There were no vitals filed for this visit.      Subjective Assessment - 09/22/15 1110    Subjective "felt fatigued the last 2 days, didn't get much relief with sleeping, the shooting pain hasn't occured as often"    Currently in Pain? Yes   Pain Score 8   at its worst   Pain Location Knee   Pain Orientation Left   Pain Type Chronic pain   Pain Frequency Intermittent   Aggravating Factors  R sidelying in bed, rising from chair                         [facility] Treatment/Exercise - 09/22/15 0001      Knee/Hip Exercises: Seated   Long Arc Quad AROM;Strengthening;3 sets;15 reps  5# with ball squeeze   Long Arc Quad Limitations 1 set with oscillations staying within mid range x 20   Sit to [employer] 3  sets;15 reps  with ball squeeze, table elevated     Knee/Hip Exercises: Supine   Straight Leg Raise with External Rotation AROM;Strengthening;Left;3 sets;15 reps  4#     Electrical Stimulation   Electrical Stimulation Location proximal peroneal longus (below fibular head) and distal vastus laterlis   Electrical Stimulation Action Pre-mod for e-stim for DN   Electrical Stimulation Parameters frequency set at 10, vastus lateralis at 1/2 to 2nd solid dot, 1st solid dot x 10 min  twitch for muscle fatigue     Manual Therapy   McConnell Lateral to medial patellar taping with rotational correction pulling inferior pole medially          Trigger Point Dry Needling - 09/22/15 1222    Consent Given? Yes   Education Handout Provided Yes  given previously   Muscles Treated Lower Body --  peroneal longus x 2   Tensor Fascia Lata Response Twitch response elicited;Palpable increased muscle length   Quadriceps Response Twitch response elicited;Palpable increased muscle length  vastus lateralis x 5                   PT Long Term Goals - 09/13/15 1326  PT LONG TERM GOAL #1   Title "Pt will be independent with advanced HEP   Time 6   Period Weeks   Status On-going     PT LONG TERM GOAL #2   Title Pt will be able to demonstrate increase functional AROM of knee flexion with squat on left 120 or better   Time 6   Period Weeks   Status On-going     PT LONG TERM GOAL #3   Title "Pain will decrease to 3/10 or below with all functional activities   Time 6   Period Weeks   Status On-going     PT LONG TERM GOAL #4   Title "FOTO will improve from 61% limtation   to 42 %limtation    indicating improved functional mobility   Time 6   Period Weeks   Status On-going     PT LONG TERM GOAL #5   Title Pt to be able to verbalize pain management techniques    Baseline instructed initially   Time 6   Period Weeks   Status On-going     PT LONG TERM GOAL #6   Title Pt will be  able to hike on uneven trails for 1 mile without exacerbating pain   Time 6   Period Weeks   Status On-going               Plan - 09/22/15 1224    Clinical Impression Statement Mr. Schweiss reports fatigue in the lateral aspect of the L knee and less shooting pain, but still has soreness with R sidelying. pt is able to lay on his R side with a pillow between his knee and reports no pain in the knees, that returns when the pillow is removed.  Today focused on VMO activations in sitting, lying and standing which he is unable to perform full sit to stand squat with ball squeeze due to weakness despite doing squats at home using 180#. Following DN and E-stim pt reported feeling sore/ fatigued, re-applied McConnell tape andhad pt lyon his R side and he reported no pain.   PT Next Visit Plan assess dry needling, give Initial HEP, possible hamstring and e stim dry needling, try DN of glute med, VMO exercise, squat strength with proper form.     PT Home Exercise Plan seated  knee extension with ball squeeze   Consulted and Agree with Plan of Care Patient      Patient will benefit from skilled therapeutic intervention in order to improve the following deficits and impairments:  Pain, Postural dysfunction, Improper body mechanics, Impaired flexibility, Increased fascial restricitons, Decreased strength, Decreased range of motion, Decreased activity tolerance  Visit Diagnosis: Pain in left knee  Cramp and spasm  Stiffness of left knee, not elsewhere classified     Problem List Patient Active Problem List   Diagnosis Date Noted  . S/P total knee arthroplasty 12/08/2012   Lulu Riding PT, DPT, LAT, ATC  09/22/15  12:32 PM      [facility] [address] [place], [place], [address] Phone: [phone]   Fax:  [phone]  Name: Philip James MRN: [id number] Date of Birth: [birth year]

## 2015-09-26 ENCOUNTER — Ambulatory Visit: Payer: 59 | Admitting: Physical Therapy

## 2015-09-26 DIAGNOSIS — M25562 Pain in left knee: Secondary | ICD-10-CM

## 2015-09-26 DIAGNOSIS — R252 Cramp and spasm: Secondary | ICD-10-CM

## 2015-09-26 DIAGNOSIS — M25662 Stiffness of left knee, not elsewhere classified: Secondary | ICD-10-CM

## 2015-09-26 NOTE — Therapy (Signed)
[place], [place], [address] Phone: [phone]   Fax:  [phone]  Physical Therapy Treatment  Patient Details  Name: Philip James MRN: [id number] Date of Birth: [birth year] Referring Provider: Vickey Huger MD  Encounter Date: 09/26/2015      PT End of Session - 09/26/15 0906    Visit Number 5   Number of Visits 12   Date for PT Re-Evaluation 10/20/15   PT Start Time 0802   PT Stop Time 0852   PT Time Calculation (min) 50 min   Activity Tolerance Patient tolerated treatment well   Behavior During Therapy [facility] for tasks assessed/performed      Past Medical History:  Diagnosis Date  . Arthritis   . Cough    developed a cough after getting a flu shot    Past Surgical History:  Procedure Laterality Date  . BICEPS TENDON REPAIR Right 2005  . KNEE ARTHROSCOPY Right 2014  . KNEE ARTHROSCOPY Left    x8 cartilage repair, x3 ACL most recent 3 years ago  . TOTAL KNEE ARTHROPLASTY Left 12/08/2012   Dr Ronnie Derby  . TOTAL KNEE ARTHROPLASTY Left 12/08/2012   Procedure: TOTAL KNEE ARTHROPLASTY;  Surgeon: Vickey Huger, MD;  [place];  Service: Orthopedics;  Laterality: Left;    There were no vitals filed for this visit.      Subjective Assessment - 09/26/15 0807    Subjective "doing okay, The nights still have been difficult but feeling better in the day with decreased chronic pain" pt purchased ankle weights for exercises and ball for form.    Currently in Pain? Yes   Pain Score 8    Pain Location Knee   Pain Orientation Left   Pain Type Chronic pain   Pain Onset More than a month ago   Pain Frequency Intermittent                         [facility] Adult PT Treatment/Exercise - 09/26/15 0001      Knee/Hip Exercises: Stretches   ITB Stretch 2 reps;30 seconds   Other Knee/Hip Stretches vastus lateralis stretch 2 x 30 on L     Manual Therapy   Soft tissue mobilization IASTM  along distal/ mid vastus lateralis / DTM along glute medius on L   McConnell Lateral to medial patellar taping with rotational correction pulling inferior pole medially          Trigger Point Dry Needling - 09/26/15 0812    Consent Given? Yes   Education Handout Provided Yes  given previously   Muscles Treated Lower Body Gluteus minimus   Gluteus Minimus Response Twitch response elicited;Palpable increased muscle length  glute med x 5 with pistoning technique   Quadriceps Response Twitch response elicited;Palpable increased muscle length  vastus lateralus x 5 with pistoning technique              PT Education - 09/26/15 0909    Education provided Yes   Education Details discussed beneifts of patellar taping, but effectiveness of the tape does wear off, the only thing to keep the patella in place without loosening is a brace.    Person(s) Educated Patient   Methods Explanation;Verbal cues   Comprehension Verbalized understanding;Verbal cues required             PT Long Term Goals - 09/26/15 0912      PT LONG TERM GOAL #1   Title "  Pt will be independent with advanced HEP   Time 6   Period Weeks   Status Achieved     PT LONG TERM GOAL #2   Title Pt will be able to demonstrate increase functional AROM of knee flexion with squat on left 120 or better   Time 6   Period Weeks   Status On-going     PT LONG TERM GOAL #3   Title "Pain will decrease to 3/10 or below with all functional activities   Time 6   Period Weeks   Status On-going     PT LONG TERM GOAL #4   Title "FOTO will improve from 61% limtation   to 42 %limtation    indicating improved functional mobility   Time 6   Period Weeks   Status Unable to assess     PT LONG TERM GOAL #5   Title Pt to be able to verbalize pain management techniques    Time 6   Period Weeks   Status On-going     PT LONG TERM GOAL #6   Title Pt will be able to hike on uneven trails for 1 mile without exacerbating pain    Time 6   Period Weeks   Status On-going               Plan - 09/26/15 0907    Clinical Impression Statement pt reports he has been consistent with his HEP for VMO activation and recently bought cuff weights and reported feeling it work the inner thigh. continues to report soreness at night but decreased shooting pain in standing/ walking. focused on manual of the L glute med/ mid/ distal vastus lateralis. pt continues to report decreased pain with taping.  pt met STG #1.    PT Next Visit Plan assess dry needling, DN over vastus lateralis/ glute med? VMO exercise, squat strength with proper form, wall squat vs. sit with ball squeeze,McConnel  taping with rotation   Consulted and Agree with Plan of Care Patient      Patient will benefit from skilled therapeutic intervention in order to improve the following deficits and impairments:  Pain, Postural dysfunction, Improper body mechanics, Impaired flexibility, Increased fascial restricitons, Decreased strength, Decreased range of motion, Decreased activity tolerance  Visit Diagnosis: Pain in left knee  Cramp and spasm  Stiffness of left knee, not elsewhere classified     Problem List Patient Active Problem List   Diagnosis Date Noted  . S/P total knee arthroplasty 12/08/2012   [name] PT, DPT, LAT, ATC  09/26/15  9:14 AM      [facility] [address] [place], [place], [address] Phone: [phone]   Fax:  [phone]  Name: Philip James MRN: [id number] Date of Birth: 1965-03-11

## 2015-09-28 ENCOUNTER — Ambulatory Visit: Payer: 59 | Admitting: Physical Therapy

## 2015-09-28 DIAGNOSIS — M25562 Pain in left knee: Secondary | ICD-10-CM

## 2015-09-28 DIAGNOSIS — M25662 Stiffness of left knee, not elsewhere classified: Secondary | ICD-10-CM

## 2015-09-28 DIAGNOSIS — R252 Cramp and spasm: Secondary | ICD-10-CM

## 2015-09-28 NOTE — Therapy (Signed)
[facility] Outpatient Rehabilitation [facility] [address] [place], [place], [address] Phone: [phone]   Fax:  [phone]  Physical Therapy Treatment  Patient Details  Name: Philip James MRN: [id number] Date of Birth: 1965-05-04 Referring Provider: Dannielle Huh MD  Encounter Date: 09/28/2015      PT End of Session - 09/28/15 0917    Visit Number 6   Number of Visits 12   Date for PT Re-Evaluation 10/20/15   PT Start Time 0800   PT Stop Time 0848   PT Time Calculation (min) 48 min   Activity Tolerance Patient tolerated treatment well   Behavior During Therapy [facility] for tasks assessed/performed      Past Medical History:  Diagnosis Date  . Arthritis   . Cough    developed a cough after getting a flu shot    Past Surgical History:  Procedure Laterality Date  . BICEPS TENDON REPAIR Right 2005  . KNEE ARTHROSCOPY Right 2014  . KNEE ARTHROSCOPY Left    x8 cartilage repair, x3 ACL most recent 3 years ago  . TOTAL KNEE ARTHROPLASTY Left 12/08/2012   Dr Sherlean Foot  . TOTAL KNEE ARTHROPLASTY Left 12/08/2012   Procedure: TOTAL KNEE ARTHROPLASTY;  Surgeon: Dannielle Huh, MD;  [facility] OR;  Service: Orthopedics;  Laterality: Left;    There were no vitals filed for this visit.      Subjective Assessment - 09/28/15 0805    Subjective "I feel like I am getting alittle more strength in the knee, and was able to sleep last night for the first time in a long time without waking up"    Currently in Pain? Yes   Pain Score 8   when it kicks in but not as frequent   Pain Location Knee   Pain Orientation Left   Pain Descriptors / Indicators Sore   Pain Type Chronic pain   Pain Frequency Intermittent                         [facility] Adult PT Treatment/Exercise - 09/28/15 0809      Knee/Hip Exercises: Aerobic   Nustep L 5 x 5 attempted but pt could only do 2 min  LE only     Knee/Hip Exercises: Standing   Step Down 2 sets;10 reps;Step  Height: 6"  L LLE only   Functional Squat 1 set;10 reps     Knee/Hip Exercises: Supine   Short Arc Quad Sets AROM;Strengthening;2 sets;10 reps;Left  using Blue theraband around ankle   Other Supine Knee/Hip Exercises heels on phyisoball with hip extension, performeding knee fleixion/ extension while keeping ball squeeze 3 x 10     Manual Therapy   Soft tissue mobilization IASTM along distal/ mid vastus lateralis / DTM along glute medius on L   McConnell Lateral to medial patellar taping with rotational correction pulling inferior pole medially                     PT Long Term Goals - 09/26/15 0912      PT LONG TERM GOAL #1   Title "Pt will be independent with advanced HEP   Time 6   Period Weeks   Status Achieved     PT LONG TERM GOAL #2   Title Pt will be able to demonstrate increase functional AROM of knee flexion with squat on left 120 or better   Time 6   Period Weeks   Status On-going  PT LONG TERM GOAL #3   Title "Pain will decrease to 3/10 or below with all functional activities   Time 6   Period Weeks   Status On-going     PT LONG TERM GOAL #4   Title "FOTO will improve from 61% limtation   to 42 %limtation    indicating improved functional mobility   Time 6   Period Weeks   Status Unable to assess     PT LONG TERM GOAL #5   Title Pt to be able to verbalize pain management techniques    Time 6   Period Weeks   Status On-going     PT LONG TERM GOAL #6   Title Pt will be able to hike on uneven trails for 1 mile without exacerbating pain   Time 6   Period Weeks   Status On-going               Plan - 09/28/15 [id number]    Clinical Impression Statement Philip James continues to make progress with report of decreased shooting pain throughout the day and he got his first night of uninterrupted sleep in a very long time. Focused todays session on strengthing with focus on VMO activity which he perofrmed well. conitnued taping with rotation to  conitnue to provide relief. pt declined modalities post    PT Next Visit Plan DN over vastus lateralis/ glute med? VMO exercise, squat strength with proper form, wall squat vs. sit with ball squeeze,McConnel  taping with rotation   Consulted and Agree with Plan of Care Patient      Patient will benefit from skilled therapeutic intervention in order to improve the following deficits and impairments:  Pain, Postural dysfunction, Improper body mechanics, Impaired flexibility, Increased fascial restricitons, Decreased strength, Decreased range of motion, Decreased activity tolerance  Visit Diagnosis: Pain in left knee  Cramp and spasm  Stiffness of left knee, not elsewhere classified     Problem List Patient Active Problem List   Diagnosis Date Noted  . S/P total knee arthroplasty 12/08/2012   Philip James PT, DPT, LAT, ATC  09/28/15  9:32 AM      [facility] [address] [place], [place], [address] Phone: [phone]   Fax:  [phone]  Name: Philip James MRN: [id number] Date of Birth: May 19, 1965

## 2015-10-04 ENCOUNTER — Ambulatory Visit: Payer: 59 | Admitting: Physical Therapy

## 2015-10-04 DIAGNOSIS — M25562 Pain in left knee: Secondary | ICD-10-CM

## 2015-10-04 DIAGNOSIS — M25662 Stiffness of left knee, not elsewhere classified: Secondary | ICD-10-CM

## 2015-10-04 DIAGNOSIS — R252 Cramp and spasm: Secondary | ICD-10-CM

## 2015-10-04 NOTE — Therapy (Signed)
[facility] [address] [place], [place], [address] Phone: [phone]   Fax:  [phone]  Physical Therapy Treatment  Patient Details  Name: Philip James MRN: [id number] Date of Birth: [birth year] Referring Provider: Dannielle HuhLucey, Steve MD  Encounter Date: 10/04/2015      PT End of Session - 10/04/15 1058    Visit Number 7   Number of Visits 12   Date for PT Re-Evaluation 10/20/15   PT Start Time 0802   PT Stop Time 0846   PT Time Calculation (min) 44 min   Activity Tolerance Patient tolerated treatment well   Behavior During Therapy [facility] for tasks assessed/performed      Past Medical History:  Diagnosis Date  . Arthritis   . Cough    developed a cough after getting a flu shot    Past Surgical History:  Procedure Laterality Date  . BICEPS TENDON REPAIR Right 2005  . KNEE ARTHROSCOPY Right 2014  . KNEE ARTHROSCOPY Left    x8 cartilage repair, x3 ACL most recent 3 years ago  . TOTAL KNEE ARTHROPLASTY Left 12/08/2012   Dr Sherlean FootLucey  . TOTAL KNEE ARTHROPLASTY Left 12/08/2012   Procedure: TOTAL KNEE ARTHROPLASTY;  Surgeon: Dannielle HuhSteve Lucey, MD;  Location: [facility] OR;  Service: Orthopedics;  Laterality: Left;    There were no vitals filed for this visit.      Subjective Assessment - 10/04/15 0806    Subjective "Feeling like I am still making progress with less frequent pain, and some improvement at night" pt reports the knee hasn't locked at night.    Currently in Pain? Yes   Pain Score 8   less frequent   Pain Location Knee   Pain Orientation Left   Pain Descriptors / Indicators Burning   Pain Type Chronic pain   Pain Onset More than a month ago   Pain Frequency Intermittent   Aggravating Factors  R sidelying in bed, rising from chair   Pain Relieving Factors none            [facility] PT Assessment - 10/04/15 0001      Observation/Other Assessments   Focus on Therapeutic Outcomes (FOTO)  47% limited     PROM   Left  Knee Flexion 114                     [facility] Adult PT Treatment/Exercise - 10/04/15 0001      Knee/Hip Exercises: Stretches   Other Knee/Hip Stretches vastus lateralis stretch 2 x 30 on L     Knee/Hip Exercises: Standing   Step Down Left;2 sets;10 reps;Step Height: 6"  combined with TKE using blue theraband   Functional Squat 1 set;10 reps     Manual Therapy   Soft tissue mobilization IASTM along distal/ mid vastus lateralis / DTM along glute medius on L   McConnell Lateral to medial patellar taping with rotational correction pulling inferior pole medially          Trigger Point Dry Needling - 10/04/15 0831    Consent Given? Yes   Education Handout Provided Yes   Quadriceps Response Twitch response elicited;Palpable increased muscle length  x 7 in vastus lateralis with pistoning /twisting technique               PT Education - 10/04/15 1101    Education provided Yes   Education Details reviewed HEP and discussed progression    Person(s) Educated Patient   Methods Explanation;Verbal cues  Comprehension Verbalized understanding;Verbal cues required             PT Long Term Goals - 09/26/15 0912      PT LONG TERM GOAL #1   Title "Pt will be independent with advanced HEP   Time 6   Period Weeks   Status Achieved     PT LONG TERM GOAL #2   Title Pt will be able to demonstrate increase functional AROM of knee flexion with squat on left 120 or better   Time 6   Period Weeks   Status On-going     PT LONG TERM GOAL #3   Title "Pain will decrease to 3/10 or below with all functional activities   Time 6   Period Weeks   Status On-going     PT LONG TERM GOAL #4   Title "FOTO will improve from 61% limtation   to 42 %limtation    indicating improved functional mobility   Time 6   Period Weeks   Status Unable to assess     PT LONG TERM GOAL #5   Title Pt to be able to verbalize pain management techniques    Time 6   Period Weeks   Status  On-going     PT LONG TERM GOAL #6   Title Pt will be able to hike on uneven trails for 1 mile without exacerbating pain   Time 6   Period Weeks   Status On-going               Plan - 10/04/15 1059    Clinical Impression Statement pt continues to report less frequent pain in the knee and improved sleep at night. conitnued DN along the vastus lateralis on the L follwoing with IASTM. pt was able to perform exercise following manual with report of fatigue but no report of pain.    PT Next Visit Plan DN over vastus lateralis/ glute med? VMO exercise, squat strength with proper form, wall squat vs. sit with ball squeeze,McConnel  taping with rotation   Consulted and Agree with Plan of Care Patient      Patient will benefit from skilled therapeutic intervention in order to improve the following deficits and impairments:  Pain, Postural dysfunction, Improper body mechanics, Impaired flexibility, Increased fascial restricitons, Decreased strength, Decreased range of motion, Decreased activity tolerance  Visit Diagnosis: Pain in left knee  Cramp and spasm  Stiffness of left knee, not elsewhere classified     Problem List Patient Active Problem List   Diagnosis Date Noted  . S/P total knee arthroplasty 12/08/2012   Lulu Riding PT, DPT, LAT, ATC  10/04/15  11:02 AM      [facility] [address] [place], [place], [address] Phone: [phone]   Fax:  [phone]  Name: Philip James MRN: [id number] Date of Birth: November 22, 1965

## 2015-10-06 ENCOUNTER — Encounter: Payer: 59 | Admitting: Physical Therapy

## 2015-10-10 ENCOUNTER — Ambulatory Visit: Payer: 59 | Attending: Orthopedic Surgery | Admitting: Physical Therapy

## 2015-10-10 DIAGNOSIS — M25562 Pain in left knee: Secondary | ICD-10-CM | POA: Diagnosis not present

## 2015-10-10 DIAGNOSIS — G8929 Other chronic pain: Secondary | ICD-10-CM | POA: Diagnosis present

## 2015-10-10 DIAGNOSIS — M25662 Stiffness of left knee, not elsewhere classified: Secondary | ICD-10-CM | POA: Diagnosis present

## 2015-10-10 DIAGNOSIS — R252 Cramp and spasm: Secondary | ICD-10-CM | POA: Diagnosis present

## 2015-10-10 NOTE — Therapy (Signed)
[facility] [address] [place], [place], [address] Phone: [phone]   Fax:  [phone]  Physical Therapy Treatment  Patient Details  Name: Philip PostWilliam L James MRN: [id number] Date of Birth: 07/04/1965 Referring Provider: Dannielle HuhLucey, Steve MD  Encounter Date: 10/10/2015      PT End of Session - 10/10/15 1130    Visit Number 8   Number of Visits 12   Date for PT Re-Evaluation 10/20/15   PT Start Time 1015   PT Stop Time 1102   PT Time Calculation (min) 47 min   Activity Tolerance Patient tolerated treatment well   Behavior During Therapy [facility] for tasks assessed/performed      Past Medical History:  Diagnosis Date  . Arthritis   . Cough    developed a cough after getting a flu shot    Past Surgical History:  Procedure Laterality Date  . BICEPS TENDON REPAIR Right 2005  . KNEE ARTHROSCOPY Right 2014  . KNEE ARTHROSCOPY Left    x8 cartilage repair, x3 ACL most recent 3 years ago  . TOTAL KNEE ARTHROPLASTY Left 12/08/2012   Dr Sherlean FootLucey  . TOTAL KNEE ARTHROPLASTY Left 12/08/2012   Procedure: TOTAL KNEE ARTHROPLASTY;  Surgeon: Dannielle HuhSteve Lucey, MD;  [facility] OR;  Service: Orthopedics;  Laterality: Left;    There were no vitals filed for this visit.      Subjective Assessment - 10/10/15 1019    Subjective "[place] and had a significant cramping which I spoke with my daughter and realized that the night pain could be due to dehydration and having significant cramping and may supplement"    Currently in Pain? Yes   Pain Score 8   when it bothers him   Pain Location Knee   Pain Orientation Left   Pain Type Chronic pain   Pain Onset More than a month ago   Pain Frequency Intermittent   Aggravating Factors  R sidelying in bed, rising from chair    Pain Relieving Factors none                         [facility] Adult PT Treatment/Exercise - 10/10/15 0001      Knee/Hip Exercises:  Standing   Forward Lunges Both;2 sets;10 reps  touching down onto BOSU     Forward Lunges Limitations with medial pull of lead leg for glute med activation using blue theraband   Functional Squat 2 sets;15 reps  with blue theraband around knees going to lowered table     Knee/Hip Exercises: Supine   Bridges Limitations bridge on physioball 2x 15 with 1/2 knee flexion/ extension (focus on TKE )  verbal cues to keep      Knee/Hip Exercises: Sidelying   Hip ABduction AROM;Strengthening;Left;2 sets;15 reps  elliptical pattern CW/CCW     Manual Therapy   Soft tissue mobilization IASTM along distal/ mid vastus lateralis / DTM along glute medius on L   McConnell --          Trigger Point Dry Needling - 10/10/15 1040    Consent Given? Yes   Education Handout Provided Yes  given previously   Quadriceps Response Twitch response elicited;Palpable increased muscle length  vastus lateralis x 8 with pistoning/ twisting               PT Education - 10/10/15 1129    Education provided Yes   Education Details work on  drinking more water to see if it is an issue with more cramping at night before working in other aspects, because doing too much it would be hard to see what really worked.    Person(s) Educated Patient   Methods Explanation;Verbal cues   Comprehension Verbalized understanding;Verbal cues required             PT Long Term Goals - 09/26/15 0912      PT LONG TERM GOAL #1   Title "Pt will be independent with advanced HEP   Time 6   Period Weeks   Status Achieved     PT LONG TERM GOAL #2   Title Pt will be able to demonstrate increase functional AROM of knee flexion with squat on left 120 or better   Time 6   Period Weeks   Status On-going     PT LONG TERM GOAL #3   Title "Pain will decrease to 3/10 or below with all functional activities   Time 6   Period Weeks   Status On-going     PT LONG TERM GOAL #4   Title "FOTO will improve from 61% limtation    to 42 %limtation    indicating improved functional mobility   Time 6   Period Weeks   Status Unable to assess     PT LONG TERM GOAL #5   Title Pt to be able to verbalize pain management techniques    Time 6   Period Weeks   Status On-going     PT LONG TERM GOAL #6   Title Pt will be able to hike on uneven trails for 1 mile without exacerbating pain   Time 6   Period Weeks   Status On-going               Plan - 10/10/15 1130    Clinical Impression Statement Mr. Besson reported increased pain inthe knee while in [facility] which he attributes to being severly dehydrated and plans to drink more water. continued DN to of the vastus lateralis following with IASTM soft tissue work. pt was able to do exercises with no report of soreness, added glute med exercises due to weakness on L comared bil.  and opted not to get taped today to assess how he is doing with treatment.   PT Next Visit Plan DN over vastus lateralis/ glute med? VMO exercise, squat strength with proper form, wall squat vs. sit with ball squeeze,McConnel  taping with rotation.   Consulted and Agree with Plan of Care Patient      Patient will benefit from skilled therapeutic intervention in order to improve the following deficits and impairments:  Pain, Postural dysfunction, Improper body mechanics, Impaired flexibility, Increased fascial restricitons, Decreased strength, Decreased range of motion, Decreased activity tolerance  Visit Diagnosis: Left knee pain, unspecified chronicity  Cramp and spasm  Stiffness of left knee, not elsewhere classified     Problem List Patient Active Problem List   Diagnosis Date Noted  . S/P total knee arthroplasty 12/08/2012   Lulu Riding PT, DPT, LAT, ATC  10/10/15  11:39 AM      [facility] [address] [place], [place], [address] Phone: [phone]   Fax:  [phone]  Name: Philip James MRN:  [id number] Date of Birth: Jan 23, 1965

## 2015-10-13 ENCOUNTER — Encounter: Payer: 59 | Admitting: Physical Therapy

## 2015-10-14 ENCOUNTER — Ambulatory Visit: Payer: 59 | Admitting: Physical Therapy

## 2015-10-14 DIAGNOSIS — M25562 Pain in left knee: Secondary | ICD-10-CM | POA: Diagnosis not present

## 2015-10-14 DIAGNOSIS — R252 Cramp and spasm: Secondary | ICD-10-CM

## 2015-10-14 DIAGNOSIS — G8929 Other chronic pain: Secondary | ICD-10-CM

## 2015-10-14 DIAGNOSIS — M25662 Stiffness of left knee, not elsewhere classified: Secondary | ICD-10-CM

## 2015-10-14 NOTE — Therapy (Signed)
[facility] [address] [place], [place], [address] Phone: [phone]   Fax:  [phone]  Physical Therapy Treatment  Patient Details  Name: Philip James MRN: [id number] Date of Birth: [birth year] Referring Provider: Dannielle Huh MD  Encounter Date: 10/14/2015      PT End of Session - 10/14/15 1223    Visit Number 9   Number of Visits 12   Date for PT Re-Evaluation 10/20/15   PT Start Time 1016   PT Stop Time 1103   PT Time Calculation (min) 47 min   Activity Tolerance Patient tolerated treatment well   Behavior During Therapy [facility] for tasks assessed/performed      Past Medical History:  Diagnosis Date  . Arthritis   . Cough    developed a cough after getting a flu shot    Past Surgical History:  Procedure Laterality Date  . BICEPS TENDON REPAIR Right 2005  . KNEE ARTHROSCOPY Right 2014  . KNEE ARTHROSCOPY Left    x8 cartilage repair, x3 ACL most recent 3 years ago  . TOTAL KNEE ARTHROPLASTY Left 12/08/2012   Dr Sherlean Foot  . TOTAL KNEE ARTHROPLASTY Left 12/08/2012   Procedure: TOTAL KNEE ARTHROPLASTY;  Surgeon: Dannielle Huh, MD;  Location: [facility] OR;  Service: Orthopedics;  Laterality: Left;    There were no vitals filed for this visit.      Subjective Assessment - 10/14/15 1021    Subjective "I have been drinking more water, I feel like I have been sleeping better but I did take some medication, but overall I think it is making a big difference"  pt reports frequency is 20% of what it was compared to a week.   Currently in Pain? Yes   Pain Score 7    Pain Onset More than a month ago   Pain Frequency Intermittent                         [facility] Adult PT Treatment/Exercise - 10/14/15 0001      Knee/Hip Exercises: Standing   Step Down Left;2 sets;10 reps;Other (comment)  on slant board   Functional Squat 1 set;10 reps  with heels on cuff weights to put ankle in PF position   Other Standing Knee  Exercises x 5 pistol squat with table at elevated height  verbal cues for control dueing descent avoding medial collap   Other Standing Knee Exercises attempted bulgarian split squat x 3 but was able to perform well     Knee/Hip Exercises: Supine   Short Arc Quad Sets Strengthening;Left;2 sets;15 reps  15#     Knee/Hip Exercises: Sidelying   Hip ABduction Strengthening;Left;2 sets;15 reps  7.5#, controlled eccentric lowering     Electrical Stimulation   Electrical Stimulation Location L vastus lateralis   Electrical Stimulation Action Pre-mod E-stim for DN   Electrical Stimulation Parameters CRP at 12 with intensity at 2nd solid dot   Electrical Stimulation Goals Pain;Other (comment)  fatigue                     PT Long Term Goals - 09/26/15 0912      PT LONG TERM GOAL #1   Title "Pt will be independent with advanced HEP   Time 6   Period Weeks   Status Achieved     PT LONG TERM GOAL #2   Title Pt will be able to demonstrate increase functional AROM of knee flexion with  squat on left 120 or better   Time 6   Period Weeks   Status On-going     PT LONG TERM GOAL #3   Title "Pain will decrease to 3/10 or below with all functional activities   Time 6   Period Weeks   Status On-going     PT LONG TERM GOAL #4   Title "FOTO will improve from 61% limtation   to 42 %limtation    indicating improved functional mobility   Time 6   Period Weeks   Status Unable to assess     PT LONG TERM GOAL #5   Title Pt to be able to verbalize pain management techniques    Time 6   Period Weeks   Status On-going     PT LONG TERM GOAL #6   Title Pt will be able to hike on uneven trails for 1 mile without exacerbating pain   Time 6   Period Weeks   Status On-going               Plan - 10/14/15 1223    Clinical Impression Statement Mr. Philip James reports significant improvement since last session rated at 80%, stating that the combination of drinking more water and the  treatment was really helped. Continued DN of the L vastus with E-stim followed with functional exercises and lifting which he was able to do with no pain except for the bulgarian split squat. Plan to re-evaluate pt next visit.    PT Next Visit Plan DN over vastus lateralis/ glute med? VMO exercise, squat strength with proper form, wall squat vs. sit with ball squeeze,McConnel  taping with rotation PRN,    Consulted and Agree with Plan of Care Patient      Patient will benefit from skilled therapeutic intervention in order to improve the following deficits and impairments:     Visit Diagnosis: Left knee pain, unspecified chronicity  Cramp and spasm  Stiffness of left knee, not elsewhere classified  Chronic pain of left knee     Problem List Patient Active Problem List   Diagnosis Date Noted  . S/P total knee arthroplasty 12/08/2012   Lulu RidingKristoffer Kamsiyochukwu Buist PT, DPT, LAT, ATC  10/14/15  12:32 PM      [facility] [address] [place], [place], [address] Phone: [phone]   Fax:  [phone]  Name: Philip James MRN: [id number] Date of Birth: 04-Sep-1965

## 2015-10-18 ENCOUNTER — Ambulatory Visit: Payer: 59 | Admitting: Physical Therapy

## 2015-10-18 DIAGNOSIS — M25562 Pain in left knee: Secondary | ICD-10-CM

## 2015-10-18 DIAGNOSIS — R252 Cramp and spasm: Secondary | ICD-10-CM

## 2015-10-18 DIAGNOSIS — M25662 Stiffness of left knee, not elsewhere classified: Secondary | ICD-10-CM

## 2015-10-18 DIAGNOSIS — G8929 Other chronic pain: Secondary | ICD-10-CM

## 2015-10-18 NOTE — Therapy (Signed)
[facility] [address] [place], [place], [address] Phone: [phone]   Fax:  [phone]  Physical Therapy Treatment  Patient Details  Name: Philip James MRN: [id number] Date of Birth: 10/20/1965 Referring Provider: Dannielle Huh MD  Encounter Date: 10/18/2015      PT End of Session - 10/18/15 1029    Visit Number 10   Number of Visits 12   Date for PT Re-Evaluation 10/20/15   PT Start Time 0933   PT Stop Time 1017   PT Time Calculation (min) 44 min   Activity Tolerance Patient tolerated treatment well   Behavior During Therapy [facility] for tasks assessed/performed      Past Medical History:  Diagnosis Date  . Arthritis   . Cough    developed a cough after getting a flu shot    Past Surgical History:  Procedure Laterality Date  . BICEPS TENDON REPAIR Right 2005  . KNEE ARTHROSCOPY Right 2014  . KNEE ARTHROSCOPY Left    x8 cartilage repair, x3 ACL most recent 3 years ago  . TOTAL KNEE ARTHROPLASTY Left 12/08/2012   Dr Sherlean Foot  . TOTAL KNEE ARTHROPLASTY Left 12/08/2012   Procedure: TOTAL KNEE ARTHROPLASTY;  Surgeon: Dannielle Huh, MD;  Location: [facility] OR;  Service: Orthopedics;  Laterality: Left;    There were no vitals filed for this visit.      Subjective Assessment - 10/18/15 0936    Subjective " I feel like I am doing about the same since the last session"   Currently in Pain? Yes   Pain Score 7    Pain Location Knee   Pain Orientation Left   Pain Descriptors / Indicators Burning   Pain Type Chronic pain   Pain Onset More than a month ago   Pain Frequency Intermittent   Aggravating Factors  Prolong sitting,                         [facility] Adult PT Treatment/Exercise - 10/18/15 0001      Knee/Hip Exercises: Standing   Forward Lunges 1 set;20 reps  with medial pull of L knee to fire glute med   Forward Lunges Limitations touching down onto Bosu ball   Step Down Left;2 sets;10 reps;Other  (comment)     Knee/Hip Exercises: Supine   Short Arc Quad Sets Strengthening;Left;2 sets;20 reps  16#   Other Supine Knee/Hip Exercises iliopsoas strengtheing with abduction/ external rotation 2 x 15     Electrical Stimulation   Electrical Stimulation Location L vastus lateralis and TFL   Electrical Stimulation Action Pre- mod e-stim set up with DN   Electrical Stimulation Parameters CRP at 12, intensity at 1/2 between 1st and 2nd solid dot   Electrical Stimulation Goals Pain;Other (comment)  fatiguing muscle          Trigger Point Dry Needling - 10/18/15 1000    Tensor Fascia Lata Response Twitch response elicited;Palpable increased muscle length  x 2   Quadriceps Response Twitch response elicited;Palpable increased muscle length  vastus lateralis x 3               PT Education - 10/18/15 1029    Education provided Yes   Education Details treating the TFL to reduce the firing to calm down tightness and promote strengthening of the iliopsoas to promote relief of the ITB and TFL.    Person(s) Educated Patient   Methods Explanation;Verbal cues   Comprehension Verbalized understanding;Verbal  cues required             PT Long Term Goals - 09/26/15 0912      PT LONG TERM GOAL #1   Title "Pt will be independent with advanced HEP   Time 6   Period Weeks   Status Achieved     PT LONG TERM GOAL #2   Title Pt will be able to demonstrate increase functional AROM of knee flexion with squat on left 120 or better   Time 6   Period Weeks   Status On-going     PT LONG TERM GOAL #3   Title "Pain will decrease to 3/10 or below with all functional activities   Time 6   Period Weeks   Status On-going     PT LONG TERM GOAL #4   Title "FOTO will improve from 61% limtation   to 42 %limtation    indicating improved functional mobility   Time 6   Period Weeks   Status Unable to assess     PT LONG TERM GOAL #5   Title Pt to be able to verbalize pain management  techniques    Time 6   Period Weeks   Status On-going     PT LONG TERM GOAL #6   Title Pt will be able to hike on uneven trails for 1 mile without exacerbating pain   Time 6   Period Weeks   Status On-going               Plan - 10/18/15 1030    Clinical Impression Statement pt continues to progress with treatment with reduce pain with sleeping and throughout the day. conitnued DN of the vastus lateralis and TFL utilizing E-stim with DN on twitch to fatigue the muscles. he was abel to do exercises with no report of pain. plan to reassess pt next visit if further PT is necessary.    PT Next Visit Plan DN over vastus lateralis/ glute med? VMO exercise, squat strength with proper form, wall squat vs. sit with ball squeeze,McConnel  taping with rotation PRN,    PT Home Exercise Plan iliopsoas strengthening.    Consulted and Agree with Plan of Care Patient      Patient will benefit from skilled therapeutic intervention in order to improve the following deficits and impairments:  Pain, Postural dysfunction, Improper body mechanics, Impaired flexibility, Increased fascial restricitons, Decreased strength, Decreased range of motion, Decreased activity tolerance  Visit Diagnosis: Left knee pain, unspecified chronicity  Cramp and spasm  Stiffness of left knee, not elsewhere classified  Chronic pain of left knee     Problem List Patient Active Problem List   Diagnosis Date Noted  . S/P total knee arthroplasty 12/08/2012   Lulu RidingKristoffer Berneta Sconyers PT, DPT, LAT, ATC  10/18/15  10:34 AM      [facility] [address] [place], [place], [address] Phone: [phone]   Fax:  [phone]  Name: Philip James MRN: [id number] Date of Birth: Aug 17, 1965

## 2015-10-20 ENCOUNTER — Ambulatory Visit: Payer: 59 | Admitting: Physical Therapy

## 2015-10-20 DIAGNOSIS — M25562 Pain in left knee: Secondary | ICD-10-CM | POA: Diagnosis not present

## 2015-10-20 DIAGNOSIS — G8929 Other chronic pain: Secondary | ICD-10-CM

## 2015-10-20 DIAGNOSIS — M25662 Stiffness of left knee, not elsewhere classified: Secondary | ICD-10-CM

## 2015-10-20 DIAGNOSIS — R252 Cramp and spasm: Secondary | ICD-10-CM

## 2015-10-20 NOTE — Therapy (Signed)
[facility] [address] [place], [place], [address] Phone: [phone]   Fax:  [phone]  Physical Therapy Treatment / Re-certification  Patient Details  Name: Philip James MRN: [id number] Date of Birth: [birth year] Referring Provider: Dannielle Huh MD  Encounter Date: 10/20/2015      PT End of Session - 10/20/15 1017    Visit Number 11   Number of Visits 16   Date for PT Re-Evaluation 12/01/15   PT Start Time 0935   PT Stop Time 1018   PT Time Calculation (min) 43 min   Activity Tolerance Patient tolerated treatment well   Behavior During Therapy [facility] for tasks assessed/performed      Past Medical History:  Diagnosis Date  . Arthritis   . Cough    developed a cough after getting a flu shot    Past Surgical History:  Procedure Laterality Date  . BICEPS TENDON REPAIR Right 2005  . KNEE ARTHROSCOPY Right 2014  . KNEE ARTHROSCOPY Left    x8 cartilage repair, x3 ACL most recent 3 years ago  . TOTAL KNEE ARTHROPLASTY Left 12/08/2012   Dr Philip James  . TOTAL KNEE ARTHROPLASTY Left 12/08/2012   Procedure: TOTAL KNEE ARTHROPLASTY;  Surgeon: Philip Huh, MD;  [facility]: [facility] OR;  Service: Orthopedics;  Laterality: Left;    There were no vitals filed for this visit.      Subjective Assessment - 10/20/15 1024    Subjective "still feeling about the same since the last session"             [facility] PT Assessment - 10/20/15 0939      ROM / Strength   AROM / PROM / Strength Strength     AROM   Left Knee Extension -2   Left Knee Flexion 115  120 following treatment     Strength   Strength Assessment Site Knee;Hip   Right/Left Hip Right;Left   Left Hip ABduction 4-/5   Right/Left Knee Left   Left Knee Flexion 4+/5  pain during testing   Left Knee Extension 5/5                     [facility] Adult PT Treatment/Exercise - 10/20/15 0001      Electrical Stimulation   Electrical Stimulation Location L  vastus lateralis    Electrical Stimulation Action Pre-mod e-stim with DN    Electrical Stimulation Parameters CRP at 12 and intensity at 1/2 way to solid dot   Electrical Stimulation Goals Pain;Other (comment)  twitch to fatigue muscle     Manual Therapy   Manual therapy comments myofascial release over TFL/ hamstrings and vastus lateralis          Trigger Point Dry Needling - 10/20/15 0958    Tensor Fascia Lata Response Twitch response elicited;Palpable increased muscle length   Quadriceps Response Twitch response elicited;Palpable increased muscle length              PT Education - 10/20/15 1014    Education provided Yes   Person(s) Educated Patient   Methods Explanation   Comprehension Verbalized understanding             PT Long Term Goals - 10/20/15 1015      PT LONG TERM GOAL #1   Title "Pt will be independent with advanced HEP   Time 6   Period Weeks   Status Achieved     PT LONG TERM GOAL #2   Title Pt  will be able to demonstrate increase functional AROM of knee flexion with squat on left 120 or better   Time 6   Period Weeks   Status On-going     PT LONG TERM GOAL #3   Title "Pain will decrease to 3/10 or below with all functional activities   Time 6   Period Weeks   Status New     PT LONG TERM GOAL #4   Title "FOTO will improve from 61% limtation   to 42 %limtation    indicating improved functional mobility   Time 6   Period Weeks   Status Unable to assess     PT LONG TERM GOAL #5   Title Pt to be able to verbalize pain management techniques    Time 6   Period Weeks   Status On-going     PT LONG TERM GOAL #6   Title Pt will be able to hike on uneven trails for 1 mile without exacerbating pain   Time 6   Period Weeks   Status On-going               Plan - 10/20/15 1227    Clinical Impression Statement Mr. Philip James continues to make progress with PT and reports 80% improvement since starting therapy. cotinued DN over the  Vastus lateralis, distal hamstring and TFL following with soft tissue work. he is progressing well with goal.s plan to drop down to 1 x a week for the next couple weeks to address progress and work toward independent exercise.    Rehab Potential Good   PT Frequency 1x / week   PT Duration 6 weeks   PT Treatment/Interventions ADLs/Self Care Home Management;Cryotherapy;Electrical Stimulation;Iontophoresis 4mg /ml Dexamethasone;Gait training;Stair training;Functional mobility training;Ultrasound;Moist Heat;Therapeutic exercise;Neuromuscular re-education;Patient/family education;Passive range of motion;Scar mobilization;Manual techniques;Dry needling;Taping   PT Next Visit Plan DN over vastus lateralis/ glute med? VMO exercise, squat strength with proper form, wall squat vs. sit with ball squeeze,McConnel  taping with rotation PRN, FOTO   PT Home Exercise Plan iliopsoas strengthening.    Consulted and Agree with Plan of Care Patient      Patient will benefit from skilled therapeutic intervention in order to improve the following deficits and impairments:  Pain, Postural dysfunction, Improper body mechanics, Impaired flexibility, Increased fascial restricitons, Decreased strength, Decreased range of motion, Decreased activity tolerance  Visit Diagnosis: Left knee pain, unspecified chronicity - Plan: PT plan of care cert/re-cert  Cramp and spasm - Plan: PT plan of care cert/re-cert  Stiffness of left knee, not elsewhere classified - Plan: PT plan of care cert/re-cert  Chronic pain of left knee - Plan: PT plan of care cert/re-cert     Problem List Patient Active Problem List   Diagnosis Date Noted  . S/P total knee arthroplasty 12/08/2012   Philip James PT, DPT, LAT, ATC  10/20/15  12:33 PM      [facility] [address] [place], [place], [address] Phone: [phone]   Fax:  [phone]  Name: Philip James MRN:  [id number] Date of Birth: [birth year]

## 2015-10-25 ENCOUNTER — Ambulatory Visit: Payer: 59 | Admitting: Physical Therapy

## 2015-10-25 DIAGNOSIS — M25662 Stiffness of left knee, not elsewhere classified: Secondary | ICD-10-CM

## 2015-10-25 DIAGNOSIS — R252 Cramp and spasm: Secondary | ICD-10-CM

## 2015-10-25 DIAGNOSIS — M25562 Pain in left knee: Secondary | ICD-10-CM

## 2015-10-25 DIAGNOSIS — G8929 Other chronic pain: Secondary | ICD-10-CM

## 2015-10-25 NOTE — Therapy (Signed)
[facility] [address] [place], [place], [address] Phone: [phone]   Fax:  [phone]  Physical Therapy Treatment  Patient Details  Name: Philip James MRN: [id number] Date of Birth: 1965-04-25 Referring Provider: Dannielle Huh MD  Encounter Date: 10/25/2015      PT End of Session - 10/25/15 1037    Visit Number 12   Number of Visits 16   Date for PT Re-Evaluation 12/01/15   PT Start Time 0931   PT Stop Time 1017   PT Time Calculation (min) 46 min   Activity Tolerance Patient tolerated treatment well   Behavior During Therapy [facility] for tasks assessed/performed      Past Medical History:  Diagnosis Date  . Arthritis   . Cough    developed a cough after getting a flu shot    Past Surgical History:  Procedure Laterality Date  . BICEPS TENDON REPAIR Right 2005  . KNEE ARTHROSCOPY Right 2014  . KNEE ARTHROSCOPY Left    x8 cartilage repair, x3 ACL most recent 3 years ago  . TOTAL KNEE ARTHROPLASTY Left 12/08/2012   Dr Sherlean Foot  . TOTAL KNEE ARTHROPLASTY Left 12/08/2012   Procedure: TOTAL KNEE ARTHROPLASTY;  Surgeon: Dannielle Huh, MD;  [facility] OR;  Service: Orthopedics;  Laterality: Left;    There were no vitals filed for this visit.      Subjective Assessment - 10/25/15 0935    Subjective "doing pretty good, haven't any majore pain spots in the last week but haven't done anything in the last week to really challenge it"   Currently in Pain? No/denies   Pain Score 0-No pain   Pain Location Knee   Pain Orientation Left   Pain Frequency Intermittent   Aggravating Factors  prolong sitting   Pain Relieving Factors exercise                         [facility] Adult PT Treatment/Exercise - 10/25/15 0001      Knee/Hip Exercises: Machines for Strengthening   Cybex Knee Extension 2 x 10 up with both down with LLE only  1 set with 25#, 1 set with 35#,     Knee/Hip Exercises: Standing   Other  Standing Knee Exercises lateral band walks utilizing double "x" pattern 2 x 10   using green band     Electrical Stimulation   Electrical Stimulation Location L vastus lateralis    Electrical Stimulation Action Pre-mod e-stim for DN    Electrical Stimulation Parameters x 5 min set at frequency 12CRP intensity at 1/2 between 1st/ 2nd solid dot, 5 min increase frequency to 20 CRP and intensity to just before 2nd solid dot   Electrical Stimulation Goals Pain;Other (comment)  fatigue muscle     Manual Therapy   Manual therapy comments myofascial release over TFL/ hamstrings and vastus lateralis   Soft tissue mobilization IASTM along distal/ mid vastus lateralis / DTM along glute medius on L          Trigger Point Dry Needling - 10/25/15 1035    Quadriceps Response Palpable increased muscle length;Twitch response elicited  vastus lateralis              PT Education - 10/25/15 1036    Education provided Yes   Education Details lateral band walking, provided green theraband    Person(s) Educated Patient   Methods Explanation;Verbal cues   Comprehension Verbalized understanding;Verbal cues required  PT Long Term Goals - 10/20/15 1015      PT LONG TERM GOAL #1   Title "Pt will be independent with advanced HEP   Time 6   Period Weeks   Status Achieved     PT LONG TERM GOAL #2   Title Pt will be able to demonstrate increase functional AROM of knee flexion with squat on left 120 or better   Time 6   Period Weeks   Status On-going     PT LONG TERM GOAL #3   Title "Pain will decrease to 3/10 or below with all functional activities   Time 6   Period Weeks   Status New     PT LONG TERM GOAL #4   Title "FOTO will improve from 61% limtation   to 42 %limtation    indicating improved functional mobility   Time 6   Period Weeks   Status Unable to assess     PT LONG TERM GOAL #5   Title Pt to be able to verbalize pain management techniques    Time 6    Period Weeks   Status On-going     PT LONG TERM GOAL #6   Title Pt will be able to hike on uneven trails for 1 mile without exacerbating pain   Time 6   Period Weeks   Status On-going               Plan - 10/25/15 1037    Clinical Impression Statement Mr. Philip James reports decreased incidences of pain. continued DN with E_stim of the vastus lateralis followed with soft tissue work. pt was able to do all exercises given with no report of pain post session.    PT Next Visit Plan FOTO, DN over vastus lateralis/ glute med? VMO exercise, squat strength with proper form, wall squat vs. sit with ball squeeze,McConnel  taping with rotation PRN,    PT Home Exercise Plan iliopsoas strengthening, lateral band walks   Consulted and Agree with Plan of Care Patient      Patient will benefit from skilled therapeutic intervention in order to improve the following deficits and impairments:  Pain, Postural dysfunction, Improper body mechanics, Impaired flexibility, Increased fascial restricitons, Decreased strength, Decreased range of motion, Decreased activity tolerance  Visit Diagnosis: Left knee pain, unspecified chronicity  Cramp and spasm  Chronic pain of left knee  Stiffness of left knee, not elsewhere classified     Problem List Patient Active Problem List   Diagnosis Date Noted  . S/P total knee arthroplasty 12/08/2012   Philip James Whichard PT, DPT, LAT, ATC  10/25/15  10:42 AM      [facility] [address] [place], [place], [address] Phone: [phone]   Fax:  [phone]  Name: Philip James MRN: [id number] Date of Birth: 05/01/1965

## 2015-11-01 ENCOUNTER — Ambulatory Visit: Payer: 59 | Admitting: Physical Therapy

## 2015-11-01 DIAGNOSIS — R252 Cramp and spasm: Secondary | ICD-10-CM

## 2015-11-01 DIAGNOSIS — M25562 Pain in left knee: Secondary | ICD-10-CM

## 2015-11-01 DIAGNOSIS — M25662 Stiffness of left knee, not elsewhere classified: Secondary | ICD-10-CM

## 2015-11-01 DIAGNOSIS — G8929 Other chronic pain: Secondary | ICD-10-CM

## 2015-11-01 NOTE — Therapy (Signed)
[facility] [address] [place], [place], [address] Phone: [phone]   Fax:  [phone]  Physical Therapy Treatment  Patient Details  Name: Philip James MRN: [id number] Date of Birth: 1965-10-06 Referring Provider: Dannielle HuhLucey, Steve James  Encounter Date: 11/01/2015      PT End of Session - 11/01/15 1205    Visit Number 13   Number of Visits 16   Date for PT Re-Evaluation 12/01/15   Authorization Type [employer]   PT Start Time 1106   PT Stop Time 1150   PT Time Calculation (min) 44 min   Activity Tolerance Patient tolerated treatment well   Behavior During Therapy [facility] for tasks assessed/performed      Past Medical History:  Diagnosis Date  . Arthritis   . Cough    developed a cough after getting a flu shot    Past Surgical History:  Procedure Laterality Date  . BICEPS TENDON REPAIR Right 2005  . KNEE ARTHROSCOPY Right 2014  . KNEE ARTHROSCOPY Left    x8 cartilage repair, x3 ACL most recent 3 years ago  . TOTAL KNEE ARTHROPLASTY Left 12/08/2012   Dr Philip James  . TOTAL KNEE ARTHROPLASTY Left 12/08/2012   Procedure: TOTAL KNEE ARTHROPLASTY;  Surgeon: Philip HuhSteve Lucey, James;  [facility] OR;  Service: Orthopedics;  Laterality: Left;    There were no vitals filed for this visit.      Subjective Assessment - 11/01/15 1110    Subjective " I've had a bad week, haven't done anything in regard to exercises and having more pain/ soreness "    Currently in Pain? Yes   Pain Score 8    Pain Location Knee   Pain Orientation Left   Pain Descriptors / Indicators Aching   Pain Type Chronic pain   Pain Onset More than a month ago   Pain Frequency Intermittent   Aggravating Factors  prolong sitting, R side lying,    Pain Relieving Factors exercise, stretching,                          [facility] Adult PT Treatment/Exercise - 11/01/15 0001      Knee/Hip Exercises: Machines for Strengthening   Cybex Knee Extension 2 x 15 LLE  only 30#     Knee/Hip Exercises: Standing   Forward Lunges 1 set;20 reps   Other Standing Knee Exercises lateral band walks utilizing double "x" pattern 4 x 20     Knee/Hip Exercises: Supine   Other Supine Knee/Hip Exercises iliopsoas strengtheing with abduction/ external rotation 2 x 20     Electrical Stimulation   Electrical Stimulation Location L vastus lateralis    Electrical Stimulation Action IFC set-up E-stim for DN   Electrical Stimulation Parameters x 10 min crp set at 12 frequency intentiy 1/2 past 1st solid dot    Electrical Stimulation Goals Pain;Other (comment)  fatigue muscle     Manual Therapy   Soft tissue mobilization IASTM along distal/ mid vastus lateralis / DTM along glute medius on L          Trigger Point Dry Needling - 11/01/15 1117    Consent Given? Yes   Education Handout Provided Yes  given previously   Quadriceps Response Twitch response elicited;Palpable increased muscle length  vastus lateralis                   PT Long Term Goals - 10/20/15 1015  PT LONG TERM GOAL #1   Title "Pt will be independent with advanced HEP   Time 6   Period Weeks   Status Achieved     PT LONG TERM GOAL #2   Title Pt will be able to demonstrate increase functional AROM of knee flexion with squat on left 120 or better   Time 6   Period Weeks   Status On-going     PT LONG TERM GOAL #3   Title "Pain will decrease to 3/10 or below with all functional activities   Time 6   Period Weeks   Status New     PT LONG TERM GOAL #4   Title "FOTO will improve from 61% limtation   to 42 %limtation    indicating improved functional mobility   Time 6   Period Weeks   Status Unable to assess     PT LONG TERM GOAL #5   Title Pt to be able to verbalize pain management techniques    Time 6   Period Weeks   Status On-going     PT LONG TERM GOAL #6   Title Pt will be able to hike on uneven trails for 1 mile without exacerbating pain   Time 6   Period  Weeks   Status On-going               Plan - 11/01/15 1239    Clinical Impression Statement [name] states he has increased pain this week, and has not been consistent with his exercises due to being busy. conitnued DN over the vastus lateralis utilizing E-stim in IFC set up to facilitate fatigue via twitch. Continued soft tissue work and abductor strengthening, and VMO strengthening which he reported decreased pain post session from 7-8/10 to 2/10.    PT Next Visit Plan FOTO, DN over vastus lateralis/ glute med? VMO exercise, squat strength with proper form, wall squat vs. sit with ball squeeze,McConnel  taping with rotation PRN,    Consulted and Agree with Plan of Care Patient      Patient will benefit from skilled therapeutic intervention in order to improve the following deficits and impairments:  Pain, Postural dysfunction, Improper body mechanics, Impaired flexibility, Increased fascial restricitons, Decreased strength, Decreased range of motion, Decreased activity tolerance  Visit Diagnosis: Cramp and spasm  Chronic pain of left knee  Stiffness of left knee, not elsewhere classified     Problem List Patient Active Problem List   Diagnosis Date Noted  . S/P total knee arthroplasty 12/08/2012  Lulu Riding PT, DPT, LAT, ATC  11/01/15  12:45 PM      [facility] [address] [place], [place], [address] Phone: [phone]   Fax:  [phone]  Name: Philip James MRN: [id number] Date of Birth: Sep 12, 1965

## 2015-11-08 ENCOUNTER — Ambulatory Visit: Payer: 59 | Admitting: Physical Therapy

## 2015-11-10 ENCOUNTER — Ambulatory Visit: Payer: 59 | Attending: Orthopedic Surgery | Admitting: Physical Therapy

## 2015-11-10 DIAGNOSIS — M25662 Stiffness of left knee, not elsewhere classified: Secondary | ICD-10-CM | POA: Diagnosis present

## 2015-11-10 DIAGNOSIS — M25562 Pain in left knee: Secondary | ICD-10-CM | POA: Diagnosis present

## 2015-11-10 DIAGNOSIS — G8929 Other chronic pain: Secondary | ICD-10-CM | POA: Insufficient documentation

## 2015-11-10 DIAGNOSIS — R252 Cramp and spasm: Secondary | ICD-10-CM | POA: Diagnosis not present

## 2015-11-10 NOTE — Therapy (Addendum)
[place], [place], [address] Phone: [phone]   Fax:  [phone]  Physical Therapy Treatment / Discharge Note  Patient Details  Name: Philip James MRN: [id number] Date of Birth: [birth year] Referring Provider: Vickey Huger MD  Encounter Date: 11/10/2015      PT End of Session - 11/10/15 0849    Visit Number 14   Number of Visits 16   Date for PT Re-Evaluation 12/01/15   PT Start Time 0801   PT Stop Time 0852   PT Time Calculation (min) 51 min   Activity Tolerance Patient tolerated treatment well   Behavior During Therapy [facility] for tasks assessed/performed      Past Medical History:  Diagnosis Date  . Arthritis   . Cough    developed a cough after getting a flu shot    Past Surgical History:  Procedure Laterality Date  . BICEPS TENDON REPAIR Right 2005  . KNEE ARTHROSCOPY Right 2014  . KNEE ARTHROSCOPY Left    x8 cartilage repair, x3 ACL most recent 3 years ago  . TOTAL KNEE ARTHROPLASTY Left 12/08/2012   Dr Ronnie Derby  . TOTAL KNEE ARTHROPLASTY Left 12/08/2012   Procedure: TOTAL KNEE ARTHROPLASTY;  Surgeon: Vickey Huger, MD;  Location: [place];  Service: Orthopedics;  Laterality: Left;    There were no vitals filed for this visit.      Subjective Assessment - 11/10/15 0804    Subjective "feeling mid range between good and bad"   Currently in Pain? Yes   Pain Score 8    Pain Orientation Left   Pain Descriptors / Indicators Aching   Pain Type Chronic pain   Pain Onset More than a month ago   Pain Frequency Intermittent   Aggravating Factors  prolong sitting, R side lying, ,    Pain Relieving Factors exercise, stretching                         [facility] Adult PT Treatment/Exercise - 11/10/15 0001      Electrical Stimulation   Electrical Stimulation Location L vastus lateralis    Electrical Stimulation Action IFC set-up with DN   Electrical Stimulation Parameters x 10  min CRP set at 13 frequency, intensity set at 2nd solid dot    Electrical Stimulation Goals Pain;Other (comment)     Manual Therapy   Joint Mobilization grade 4 hip anterior/ posterior hip mobs. long axis distraction grade 5   Soft tissue mobilization IASTM along distal/ mid vastus lateralis/ TFL           Trigger Point Dry Needling - 11/10/15 0852    Consent Given? Yes   Education Handout Provided Yes   Tensor Fascia Lata Response Twitch response elicited;Palpable increased muscle length   Quadriceps Response Twitch response elicited;Palpable increased muscle length  vastus lateralis                   PT Long Term Goals - 10/20/15 1015      PT LONG TERM GOAL #1   Title "Pt will be independent with advanced HEP   Time 6   Period Weeks   Status Achieved     PT LONG TERM GOAL #2   Title Pt will be able to demonstrate increase functional AROM of knee flexion with squat on left 120 or better   Time 6   Period Weeks   Status On-going     PT LONG  TERM GOAL #3   Title "Pain will decrease to 3/10 or below with all functional activities   Time 6   Period Weeks   Status New     PT LONG TERM GOAL #4   Title "FOTO will improve from 61% limtation   to 42 %limtation    indicating improved functional mobility   Time 6   Period Weeks   Status Unable to assess     PT LONG TERM GOAL #5   Title Pt to be able to verbalize pain management techniques    Time 6   Period Weeks   Status On-going     PT LONG TERM GOAL #6   Title Pt will be able to hike on uneven trails for 1 mile without exacerbating pain   Time 6   Period Weeks   Status On-going               Plan - 11/10/15 0850    Clinical Impression Statement Mr. Philip James continues to report pain in the L lateral knee reported at intermittent. Focus on hip mobility and DN over the TFL and vastus lateralis using E-stim for the vastus lateralis to calm down tightness via twitch to fatgiue the muscle. post  session he reported decreased pain in the hip knee.    PT Next Visit Plan DN over vastus lateralis/ glute med? VMO exercise, squat strength with proper form, wall squat vs. sit with ball squeeze,McConnel taping with rotation PRN,    Consulted and Agree with Plan of Care Patient      Patient will benefit from skilled therapeutic intervention in order to improve the following deficits and impairments:  Pain, Postural dysfunction, Improper body mechanics, Impaired flexibility, Increased fascial restricitons, Decreased strength, Decreased range of motion, Decreased activity tolerance  Visit Diagnosis: Cramp and spasm  Chronic pain of left knee  Stiffness of left knee, not elsewhere classified  Left knee pain, unspecified chronicity     Problem List Patient Active Problem List   Diagnosis Date Noted  . S/P total knee arthroplasty 12/08/2012    [name] PT, DPT, LAT, ATC  11/10/15  9:50 AM      [facility] [address] [place], [place], [address] Phone: [phone]   Fax:  [phone]  Name: Philip James MRN: [id number] Date of Birth: 11/26/1965   PHYSICAL THERAPY DISCHARGE SUMMARY  Visits from Start of Care: 14  Current functional level related to goals / functional outcomes: See goals   Remaining deficits: Current status unknown due to pt not returning. As of last attended visit he continued to have pain in the L knee that was improving with exercise. Mild limitation in L knee AROM compared bil.   Education / Equipment: HEP, theraband, lifting and carrying  Plan: Patient agrees to discharge.  Patient goals were partially met. Patient is being discharged due to not returning since the last visit.  ?????     Giovannina Mun PT, DPT, LAT, ATC  01/06/16  7:52 AM

## 2015-11-22 ENCOUNTER — Ambulatory Visit: Payer: 59 | Admitting: Physical Therapy

## 2015-11-24 ENCOUNTER — Ambulatory Visit: Payer: 59 | Admitting: Physical Therapy

## 2015-11-29 ENCOUNTER — Ambulatory Visit: Payer: 59 | Admitting: Physical Therapy

## 2015-12-06 ENCOUNTER — Ambulatory Visit: Payer: 59 | Admitting: Physical Therapy

## 2015-12-08 ENCOUNTER — Ambulatory Visit: Payer: 59 | Admitting: Physical Therapy

## 2018-08-19 ENCOUNTER — Telehealth: Payer: 59 | Admitting: Family

## 2018-08-19 DIAGNOSIS — Z20822 Contact with and (suspected) exposure to covid-19: Secondary | ICD-10-CM

## 2018-08-19 NOTE — Progress Notes (Signed)
E-Visit for Corona Virus Screening   Your current symptoms could be consistent with the coronavirus.  Many health care providers can now test patients at their office but not all are.   has multiple testing sites. For information on our COVID testing locations and hours go to [email or web address]  Please quarantine yourself while awaiting your test results.  We are enrolling you in our MyChart Home Mont[facility] for COVID19 . Daily you will receive a questionnaire within the MyChart website. Our COVID 19 response team willl be monitoriing your responses daily.    COVID-19 is a respiratory illness with symptoms that are similar to the flu. Symptoms are typically mild to moderate, but there have been cases of severe illness and death due to the virus. The following symptoms may appear 2-14 days after exposure: . Fever . Cough . Shortness of breath or difficulty breathing . Chills . Repeated shaking with chills . Muscle pain . Headache . Sore throat . New loss of taste or smell . Fatigue . Congestion or runny nose . Nausea or vomiting . Diarrhea  It is vitally important that if you feel that you have an infection such as this virus or any other virus that you stay home and away from places where you may spread it to others.  You should self-quarantine for 14 days if you have symptoms that could potentially be coronavirus or have been in close contact a with a person diagnosed with COVID-19 within the last 2 weeks. You should avoid contact with people age 65 and older.   You should wear a mask or cloth face covering over your nose and mouth if you must be around other people or animals, including pets (even at home). Try to stay at least 6 feet away from other people. This will protect the people around you.  You may also take acetaminophen (Tylenol) as needed for fever.   Reduce your risk of any infection by using the same precautions used for avoiding the  common cold or flu:  . Wash your hands often with soap and warm water for at least 20 seconds.  If soap and water are not readily available, use an alcohol-based hand sanitizer with at least 60% alcohol.  . If coughing or sneezing, cover your mouth and nose by coughing or sneezing into the elbow areas of your shirt or coat, into a tissue or into your sleeve (not your hands). . Avoid shaking hands with others and consider head nods or verbal greetings only. . Avoid touching your eyes, nose, or mouth with unwashed hands.  . Avoid close contact with people who are sick. . Avoid places or events with large numbers of people in one location, like concerts or sporting events. . Carefully consider travel plans you have or are making. . If you are planning any travel outside or inside the [place], visit the CDC's Travelers' Health webpage for the latest health notices. . If you have some symptoms but not all symptoms, continue to monitor at home and seek medical attention if your symptoms worsen. . If you are having a medical emergency, call 911.  HOME CARE . Only take medications as instructed by your medical team. . Drink plenty of fluids and get plenty of rest. . A steam or ultrasonic humidifier can help if you have congestion.   GET HELP RIGHT AWAY IF YOU HAVE EMERGENCY WARNING SIGNS** FOR COVID-19. If you or someone is showing any of these signs seek emergency medical care immediately. Call   911 or proceed to your closest emergency facility if: . You develop worsening high fever. . Trouble breathing . Bluish lips or face . Persistent pain or pressure in the chest . New confusion . Inability to wake or stay awake . You cough up blood. . Your symptoms become more severe  **This list is not all possible symptoms. Contact your medical provider for any symptoms that are sever or concerning to you.   MAKE SURE YOU   Understand these instructions.  Will watch your condition.  Will get help right  away if you are not doing well or get worse.  Your e-visit answers were reviewed by a board certified advanced clinical practitioner to complete your personal care plan.  Depending on the condition, your plan could have included both over the counter or prescription medications.  If there is a problem please reply once you have received a response from your provider.  Your safety is important to us.  If you have drug allergies check your prescription carefully.    You can use MyChart to ask questions about today's visit, request a non-urgent call back, or ask for a work or school excuse for 24 hours related to this e-Visit. If it has been greater than 24 hours you will need to follow up with your provider, or enter a new e-Visit to address those concerns. You will get an e-mail in the next two days asking about your experience.  I hope that your e-visit has been valuable and will speed your recovery. Thank you for using e-visits.   Greater than 5 minutes, yet less than 10 minutes of time have been spent researching, coordinating, and implementing care for this patient today.  Thank you for the details you included in the comment boxes. Those details are very helpful in determining the best course of treatment for you and help us to provide the best care.  

## 2018-08-20 ENCOUNTER — Other Ambulatory Visit: Payer: Self-pay

## 2018-08-20 DIAGNOSIS — Z20822 Contact with and (suspected) exposure to covid-19: Secondary | ICD-10-CM

## 2018-08-21 LAB — NOVEL CORONAVIRUS, NAA: SARS-CoV-2, NAA: NOT DETECTED

## 2019-04-08 DIAGNOSIS — Z20828 Contact with and (suspected) exposure to other viral communicable diseases: Secondary | ICD-10-CM | POA: Diagnosis not present

## 2019-04-08 DIAGNOSIS — Z03818 Encounter for observation for suspected exposure to other biological agents ruled out: Secondary | ICD-10-CM | POA: Diagnosis not present

## 2019-05-12 DIAGNOSIS — N529 Male erectile dysfunction, unspecified: Secondary | ICD-10-CM | POA: Diagnosis not present

## 2019-05-12 DIAGNOSIS — E785 Hyperlipidemia, unspecified: Secondary | ICD-10-CM | POA: Diagnosis not present

## 2019-05-12 DIAGNOSIS — Z125 Encounter for screening for malignant neoplasm of prostate: Secondary | ICD-10-CM | POA: Diagnosis not present

## 2019-05-12 DIAGNOSIS — Z0000 Encounter for general adult medical examination without abnormal findings: Secondary | ICD-10-CM | POA: Diagnosis not present

## 2019-08-15 DIAGNOSIS — Z20822 Contact with and (suspected) exposure to covid-19: Secondary | ICD-10-CM | POA: Diagnosis not present

## 2019-08-15 DIAGNOSIS — Z03818 Encounter for observation for suspected exposure to other biological agents ruled out: Secondary | ICD-10-CM | POA: Diagnosis not present

## 2020-01-07 DIAGNOSIS — Z1159 Encounter for screening for other viral diseases: Secondary | ICD-10-CM | POA: Diagnosis not present

## 2020-06-20 DIAGNOSIS — Z96652 Presence of left artificial knee joint: Secondary | ICD-10-CM | POA: Diagnosis not present

## 2020-06-20 DIAGNOSIS — M25562 Pain in left knee: Secondary | ICD-10-CM | POA: Diagnosis not present

## 2020-06-23 DIAGNOSIS — N529 Male erectile dysfunction, unspecified: Secondary | ICD-10-CM | POA: Diagnosis not present

## 2020-06-23 DIAGNOSIS — Z0000 Encounter for general adult medical examination without abnormal findings: Secondary | ICD-10-CM | POA: Diagnosis not present

## 2020-06-23 DIAGNOSIS — R7309 Other abnormal glucose: Secondary | ICD-10-CM | POA: Diagnosis not present

## 2020-06-23 DIAGNOSIS — E785 Hyperlipidemia, unspecified: Secondary | ICD-10-CM | POA: Diagnosis not present

## 2020-06-23 DIAGNOSIS — Z125 Encounter for screening for malignant neoplasm of prostate: Secondary | ICD-10-CM | POA: Diagnosis not present

## 2021-06-21 DIAGNOSIS — R7309 Other abnormal glucose: Secondary | ICD-10-CM | POA: Diagnosis not present

## 2021-06-21 DIAGNOSIS — E785 Hyperlipidemia, unspecified: Secondary | ICD-10-CM | POA: Diagnosis not present

## 2021-06-21 DIAGNOSIS — E291 Testicular hypofunction: Secondary | ICD-10-CM | POA: Diagnosis not present

## 2021-06-21 DIAGNOSIS — Z125 Encounter for screening for malignant neoplasm of prostate: Secondary | ICD-10-CM | POA: Diagnosis not present

## 2021-06-23 DIAGNOSIS — N529 Male erectile dysfunction, unspecified: Secondary | ICD-10-CM | POA: Diagnosis not present

## 2021-06-23 DIAGNOSIS — Z0000 Encounter for general adult medical examination without abnormal findings: Secondary | ICD-10-CM | POA: Diagnosis not present

## 2021-06-23 DIAGNOSIS — E785 Hyperlipidemia, unspecified: Secondary | ICD-10-CM | POA: Diagnosis not present

## 2021-06-23 DIAGNOSIS — Z125 Encounter for screening for malignant neoplasm of prostate: Secondary | ICD-10-CM | POA: Diagnosis not present

## 2021-06-26 ENCOUNTER — Other Ambulatory Visit (HOSPITAL_BASED_OUTPATIENT_CLINIC_OR_DEPARTMENT_OTHER): Payer: Self-pay | Admitting: Family Medicine

## 2021-06-26 ENCOUNTER — Other Ambulatory Visit: Payer: Self-pay | Admitting: Family Medicine

## 2021-06-26 DIAGNOSIS — E785 Hyperlipidemia, unspecified: Secondary | ICD-10-CM

## 2021-07-04 ENCOUNTER — Encounter (HOSPITAL_BASED_OUTPATIENT_CLINIC_OR_DEPARTMENT_OTHER): Payer: Self-pay

## 2021-07-04 ENCOUNTER — Ambulatory Visit (HOSPITAL_BASED_OUTPATIENT_CLINIC_OR_DEPARTMENT_OTHER)
Admission: RE | Admit: 2021-07-04 | Discharge: 2021-07-04 | Disposition: A | Discharge status: Home / Self Care | Payer: 59 | Source: Ambulatory Visit | Attending: Family Medicine | Admitting: Family Medicine

## 2021-07-04 DIAGNOSIS — E785 Hyperlipidemia, unspecified: Secondary | ICD-10-CM | POA: Insufficient documentation

## 2021-07-07 DIAGNOSIS — R609 Edema, unspecified: Secondary | ICD-10-CM | POA: Diagnosis not present

## 2022-04-11 DIAGNOSIS — E785 Hyperlipidemia, unspecified: Secondary | ICD-10-CM | POA: Diagnosis not present

## 2022-07-26 DIAGNOSIS — R7309 Other abnormal glucose: Secondary | ICD-10-CM | POA: Diagnosis not present

## 2022-07-26 DIAGNOSIS — Z0000 Encounter for general adult medical examination without abnormal findings: Secondary | ICD-10-CM | POA: Diagnosis not present

## 2022-07-26 DIAGNOSIS — Z23 Encounter for immunization: Secondary | ICD-10-CM | POA: Diagnosis not present

## 2022-07-26 DIAGNOSIS — E291 Testicular hypofunction: Secondary | ICD-10-CM | POA: Diagnosis not present

## 2022-07-26 DIAGNOSIS — Z125 Encounter for screening for malignant neoplasm of prostate: Secondary | ICD-10-CM | POA: Diagnosis not present

## 2022-07-26 DIAGNOSIS — E785 Hyperlipidemia, unspecified: Secondary | ICD-10-CM | POA: Diagnosis not present

## 2022-10-02 DIAGNOSIS — E785 Hyperlipidemia, unspecified: Secondary | ICD-10-CM | POA: Diagnosis not present

## 2023-02-06 DIAGNOSIS — J111 Influenza due to unidentified influenza virus with other respiratory manifestations: Secondary | ICD-10-CM | POA: Diagnosis not present

## 2023-02-11 DIAGNOSIS — H31013 Macula scars of posterior pole (postinflammatory) (post-traumatic), bilateral: Secondary | ICD-10-CM | POA: Diagnosis not present

## 2023-07-23 DIAGNOSIS — Z125 Encounter for screening for malignant neoplasm of prostate: Secondary | ICD-10-CM | POA: Diagnosis not present

## 2023-07-23 DIAGNOSIS — R7309 Other abnormal glucose: Secondary | ICD-10-CM | POA: Diagnosis not present

## 2023-07-23 DIAGNOSIS — Z0000 Encounter for general adult medical examination without abnormal findings: Secondary | ICD-10-CM | POA: Diagnosis not present

## 2023-07-23 DIAGNOSIS — E291 Testicular hypofunction: Secondary | ICD-10-CM | POA: Diagnosis not present

## 2023-07-23 DIAGNOSIS — E785 Hyperlipidemia, unspecified: Secondary | ICD-10-CM | POA: Diagnosis not present

## 2023-07-26 DIAGNOSIS — Z23 Encounter for immunization: Secondary | ICD-10-CM | POA: Diagnosis not present

## 2023-07-26 DIAGNOSIS — Z0000 Encounter for general adult medical examination without abnormal findings: Secondary | ICD-10-CM | POA: Diagnosis not present

## 2023-10-17 DIAGNOSIS — Z23 Encounter for immunization: Secondary | ICD-10-CM | POA: Diagnosis not present
